# Patient Record
Sex: Male | Born: 1955 | Race: White | Hispanic: No | Smoking: Former smoker
Health system: Southern US, Community
[De-identification: ages and names within clinical notes are randomized; demographics above are authoritative.]

## PROBLEM LIST (undated history)

## (undated) DIAGNOSIS — I10 Essential (primary) hypertension: Secondary | ICD-10-CM

## (undated) DIAGNOSIS — C801 Malignant (primary) neoplasm, unspecified: Secondary | ICD-10-CM

## (undated) DIAGNOSIS — J189 Pneumonia, unspecified organism: Secondary | ICD-10-CM

## (undated) DIAGNOSIS — R519 Headache, unspecified: Secondary | ICD-10-CM

## (undated) DIAGNOSIS — J449 Chronic obstructive pulmonary disease, unspecified: Secondary | ICD-10-CM

## (undated) DIAGNOSIS — R350 Frequency of micturition: Secondary | ICD-10-CM

## (undated) DIAGNOSIS — I209 Angina pectoris, unspecified: Secondary | ICD-10-CM

## (undated) DIAGNOSIS — K219 Gastro-esophageal reflux disease without esophagitis: Secondary | ICD-10-CM

## (undated) DIAGNOSIS — M1990 Unspecified osteoarthritis, unspecified site: Secondary | ICD-10-CM

## (undated) DIAGNOSIS — F419 Anxiety disorder, unspecified: Secondary | ICD-10-CM

## (undated) DIAGNOSIS — F1911 Other psychoactive substance abuse, in remission: Secondary | ICD-10-CM

## (undated) DIAGNOSIS — Z789 Other specified health status: Secondary | ICD-10-CM

## (undated) HISTORY — PX: NASAL RECONSTRUCTION: SHX2069

---

## 1989-11-16 DIAGNOSIS — F1911 Other psychoactive substance abuse, in remission: Secondary | ICD-10-CM

## 1989-11-16 HISTORY — DX: Other psychoactive substance abuse, in remission: F19.11

## 2011-09-09 ENCOUNTER — Emergency Department (HOSPITAL_COMMUNITY)
Admission: EM | Admit: 2011-09-09 | Discharge: 2011-09-09 | Disposition: A | Discharge status: Home / Self Care | Payer: Commercial Managed Care - PPO | Attending: Emergency Medicine | Admitting: Emergency Medicine

## 2011-09-09 DIAGNOSIS — G8929 Other chronic pain: Secondary | ICD-10-CM | POA: Insufficient documentation

## 2011-09-09 DIAGNOSIS — IMO0001 Reserved for inherently not codable concepts without codable children: Secondary | ICD-10-CM | POA: Insufficient documentation

## 2011-09-09 DIAGNOSIS — M549 Dorsalgia, unspecified: Secondary | ICD-10-CM | POA: Insufficient documentation

## 2012-08-24 ENCOUNTER — Other Ambulatory Visit (HOSPITAL_COMMUNITY): Payer: Self-pay | Admitting: Orthopaedic Surgery

## 2012-09-30 ENCOUNTER — Encounter (HOSPITAL_COMMUNITY): Payer: Self-pay | Admitting: Pharmacy Technician

## 2012-10-03 ENCOUNTER — Encounter (HOSPITAL_COMMUNITY): Payer: Self-pay

## 2012-10-03 ENCOUNTER — Encounter (HOSPITAL_COMMUNITY)
Admission: RE | Admit: 2012-10-03 | Discharge: 2012-10-03 | Disposition: A | Discharge status: Home / Self Care | Payer: Commercial Managed Care - PPO | Source: Ambulatory Visit | Attending: Orthopaedic Surgery | Admitting: Orthopaedic Surgery

## 2012-10-03 HISTORY — DX: Angina pectoris, unspecified: I20.9

## 2012-10-03 HISTORY — DX: Other psychoactive substance abuse, in remission: F19.11

## 2012-10-03 HISTORY — DX: Unspecified osteoarthritis, unspecified site: M19.90

## 2012-10-03 LAB — BASIC METABOLIC PANEL WITH GFR
BUN: 13 mg/dL (ref 6–23)
CO2: 30 meq/L (ref 19–32)
Calcium: 9.8 mg/dL (ref 8.4–10.5)
Chloride: 101 meq/L (ref 96–112)
Creatinine, Ser: 0.86 mg/dL (ref 0.50–1.35)
GFR calc Af Amer: >90 mL/min
GFR calc non Af Amer: >90 mL/min
Glucose, Bld: 100 mg/dL — ABNORMAL HIGH (ref 70–99)
Potassium: 4.8 meq/L (ref 3.5–5.1)
Sodium: 138 meq/L (ref 135–145)

## 2012-10-03 LAB — URINALYSIS, ROUTINE W REFLEX MICROSCOPIC
Glucose, UA: NEGATIVE mg/dL
Hgb urine dipstick: NEGATIVE
Ketones, ur: NEGATIVE mg/dL
Leukocytes, UA: NEGATIVE
Nitrite: NEGATIVE
Protein, ur: NEGATIVE mg/dL
Specific Gravity, Urine: 1.036 — ABNORMAL HIGH (ref 1.005–1.030)
Urobilinogen, UA: 0.2 mg/dL (ref 0.0–1.0)
pH: 6 (ref 5.0–8.0)

## 2012-10-03 LAB — APTT: aPTT: 32 seconds (ref 24–37)

## 2012-10-03 LAB — CBC
HCT: 43.9 % (ref 39.0–52.0)
Hemoglobin: 15.2 g/dL (ref 13.0–17.0)
MCV: 85.4 fL (ref 78.0–100.0)
RBC: 5.14 MIL/uL (ref 4.22–5.81)
RDW: 12.2 % (ref 11.5–15.5)
WBC: 8.5 10*3/uL (ref 4.0–10.5)

## 2012-10-03 LAB — PROTIME-INR
INR: 0.98 (ref 0.00–1.49)
Prothrombin Time: 12.9 seconds (ref 11.6–15.2)

## 2012-10-03 LAB — SURGICAL PCR SCREEN
MRSA, PCR: NEGATIVE
Staphylococcus aureus: NEGATIVE

## 2012-10-03 NOTE — Progress Notes (Signed)
Faxed Blood refusal signature paper to [facility] with confirmation received, also noted under FYI column above. Attempted to obtain old EKG for review from approx 84yrs ago from [facility] and was told none available.  EKF from today reviewed by Dr Harriet Masson, anesthesia.  Faxed FYI to Dr Magnus Ivan regarding blood products to [phone] with confirmation

## 2012-10-03 NOTE — Progress Notes (Signed)
10/03/12 1015  OBSTRUCTIVE SLEEP APNEA  Have you ever been diagnosed with sleep apnea through a sleep study? No  Do you snore loudly (loud enough to be heard through closed doors)?  1  Do you often feel tired, fatigued, or sleepy during the daytime? 1  Has anyone observed you stop breathing during your sleep? 0  Do you have, or are you being treated for high blood pressure? 0  BMI more than 35 kg/m2? 0  Age over 57 years old? 1  Neck circumference greater than 40 cm/18 inches? 0  Gender: 1  Obstructive Sleep Apnea Score 4   Score 4 or greater  Results sent to PCP

## 2012-10-03 NOTE — Patient Instructions (Addendum)
20 Vincent Herrera  10/03/2012   Your procedure is scheduled on:  10/07/12   Friday    Surgery [id number]  Report to [facility] at  0515     AM.  Call this number if you have problems the morning of surgery: [phone]      Remember:   Do not eat food  Or drink :After Midnight. Thursday NIGHT   Take these medicines the morning of surgery with A SIP OF WATER:  MAY TAKE NORCO  ,ROBAXIN   IF NEEDED   .  Contacts, dentures or partial plates can not be worn to surgery  Leave suitcase in the car. After surgery it may be brought to your room.  For patients admitted to the hospital, checkout time is 11:00 AM day of  discharge.             SPECIAL INSTRUCTIONS- SEE [place] PREPARING FOR SURGERY INSTRUCTION SHEET-     DO NOT WEAR JEWELRY, LOTIONS, POWDERS, OR PERFUMES.  WOMEN-- DO NOT SHAVE LEGS OR UNDERARMS FOR 12 HOURS BEFORE SHOWERS. MEN MAY SHAVE FACE.  Patients discharged the day of surgery will not be allowed to drive home. IF going home the day of surgery, you must have a driver and someone to stay with you for the first 24 hours  Name and phone number of your driver:    ??? SON                                                                        Please read over the following fact sheets that you were given: MRSA Information, Incentive Spirometry Sheet, Blood Transfusion Sheet  Information                                                                                   [name]  PST [phone]  [phone]

## 2012-10-04 LAB — NO BLOOD PRODUCTS

## 2012-10-04 NOTE — Progress Notes (Signed)
Pre Cordelia Pen at Dr Vevelyn Royals office- MD is aware of refusal of blood

## 2012-10-06 NOTE — Anesthesia Preprocedure Evaluation (Addendum)
Anesthesia Evaluation  Patient identified by MRN, date of birth, ID band Patient awake    Reviewed: Allergy & Precautions, H&P , NPO status , Patient's Chart, lab work & pertinent test results  Airway Mallampati: II TM Distance: >3 FB Neck ROM: full    Dental  (+) Edentulous Upper and Dental Advisory Given   Pulmonary Current Smoker,  breath sounds clear to auscultation  Pulmonary exam normal       Cardiovascular Exercise Tolerance: Good negative cardio ROS  Rhythm:regular Rate:Normal     Neuro/Psych negative neurological ROS  negative psych ROS   GI/Hepatic negative GI ROS, Neg liver ROS,   Endo/Other  negative endocrine ROS  Renal/GU negative Renal ROS  negative genitourinary   Musculoskeletal   Abdominal   Peds  Hematology negative hematology ROS (+)   Anesthesia Other Findings   Reproductive/Obstetrics negative OB ROS                          Anesthesia Physical Anesthesia Plan  ASA: II  Anesthesia Plan: General   Post-op Pain Management:    Induction: Intravenous  Airway Management Planned: Oral ETT  Additional Equipment:   Intra-op Plan:   Post-operative Plan: Extubation in OR  Informed Consent: I have reviewed the patients History and Physical, chart, labs and discussed the procedure including the risks, benefits and alternatives for the proposed anesthesia with the patient or authorized representative who has indicated his/her understanding and acceptance.   Dental Advisory Given  Plan Discussed with: CRNA  Anesthesia Plan Comments:        Anesthesia Quick Evaluation

## 2012-10-07 ENCOUNTER — Encounter (HOSPITAL_COMMUNITY)
Admission: RE | Disposition: A | Discharge status: Home Health | Payer: Self-pay | Source: Ambulatory Visit | Attending: Orthopaedic Surgery

## 2012-10-07 ENCOUNTER — Inpatient Hospital Stay (HOSPITAL_COMMUNITY): Payer: Commercial Managed Care - PPO

## 2012-10-07 ENCOUNTER — Inpatient Hospital Stay (HOSPITAL_COMMUNITY)
Admission: RE | Admit: 2012-10-07 | Discharge: 2012-10-10 | DRG: 470 | Disposition: A | Discharge status: Home Health | Payer: Commercial Managed Care - PPO | Source: Ambulatory Visit | Attending: Orthopaedic Surgery | Admitting: Orthopaedic Surgery

## 2012-10-07 ENCOUNTER — Inpatient Hospital Stay (HOSPITAL_COMMUNITY): Payer: Commercial Managed Care - PPO | Admitting: Anesthesiology

## 2012-10-07 ENCOUNTER — Encounter (HOSPITAL_COMMUNITY): Payer: Self-pay | Admitting: *Deleted

## 2012-10-07 ENCOUNTER — Encounter (HOSPITAL_COMMUNITY): Payer: Self-pay | Admitting: Anesthesiology

## 2012-10-07 DIAGNOSIS — Z79899 Other long term (current) drug therapy: Secondary | ICD-10-CM

## 2012-10-07 DIAGNOSIS — Z01810 Encounter for preprocedural cardiovascular examination: Secondary | ICD-10-CM

## 2012-10-07 DIAGNOSIS — M169 Osteoarthritis of hip, unspecified: Secondary | ICD-10-CM

## 2012-10-07 DIAGNOSIS — D62 Acute posthemorrhagic anemia: Secondary | ICD-10-CM | POA: Diagnosis not present

## 2012-10-07 DIAGNOSIS — Z01812 Encounter for preprocedural laboratory examination: Secondary | ICD-10-CM

## 2012-10-07 DIAGNOSIS — M1610 Unilateral primary osteoarthritis, unspecified hip: Principal | ICD-10-CM | POA: Diagnosis present

## 2012-10-07 DIAGNOSIS — F17200 Nicotine dependence, unspecified, uncomplicated: Secondary | ICD-10-CM | POA: Diagnosis present

## 2012-10-07 HISTORY — PX: TOTAL HIP ARTHROPLASTY: SHX124

## 2012-10-07 SURGERY — ARTHROPLASTY, HIP, TOTAL, ANTERIOR APPROACH
Anesthesia: General | Site: Hip | Laterality: Left | Wound class: Clean

## 2012-10-07 MED ORDER — LIDOCAINE HCL (CARDIAC) 20 MG/ML IV SOLN
INTRAVENOUS | Status: DC | PRN
  Administered 2012-10-07: 50 mg via INTRAVENOUS
  Administered 2012-10-07: 30 mg via INTRAVENOUS

## 2012-10-07 MED ORDER — LACTATED RINGERS IV SOLN: INTRAVENOUS | Status: DC

## 2012-10-07 MED ORDER — KETOROLAC TROMETHAMINE 15 MG/ML IJ SOLN
15.0000 mg | Freq: Four times a day (QID) | INTRAMUSCULAR | Status: AC
  Administered 2012-10-07 – 2012-10-08 (×3): 15 mg via INTRAVENOUS
  Filled 2012-10-07 (×3): qty 1

## 2012-10-07 MED ORDER — LACTATED RINGERS IV SOLN
INTRAVENOUS | Status: DC | PRN
  Administered 2012-10-07 (×2): via INTRAVENOUS

## 2012-10-07 MED ORDER — FENTANYL CITRATE 0.05 MG/ML IJ SOLN
INTRAMUSCULAR | Status: DC | PRN
  Administered 2012-10-07: 100 ug via INTRAVENOUS

## 2012-10-07 MED ORDER — LABETALOL HCL 5 MG/ML IV SOLN
INTRAVENOUS | Status: DC | PRN
  Administered 2012-10-07: 2.5 mg via INTRAVENOUS

## 2012-10-07 MED ORDER — NEOSTIGMINE METHYLSULFATE 1 MG/ML IJ SOLN
INTRAMUSCULAR | Status: DC | PRN
  Administered 2012-10-07: 4 mg via INTRAVENOUS

## 2012-10-07 MED ORDER — MIDAZOLAM HCL 5 MG/5ML IJ SOLN
INTRAMUSCULAR | Status: DC | PRN
  Administered 2012-10-07 (×2): 1 mg via INTRAVENOUS

## 2012-10-07 MED ORDER — ROCURONIUM BROMIDE 100 MG/10ML IV SOLN
INTRAVENOUS | Status: DC | PRN
  Administered 2012-10-07: 15 mg via INTRAVENOUS
  Administered 2012-10-07: 5 mg via INTRAVENOUS
  Administered 2012-10-07: 10 mg via INTRAVENOUS
  Administered 2012-10-07: 20 mg via INTRAVENOUS

## 2012-10-07 MED ORDER — PROPOFOL 10 MG/ML IV BOLUS
INTRAVENOUS | Status: DC | PRN
  Administered 2012-10-07: 170 mg via INTRAVENOUS

## 2012-10-07 MED ORDER — METHOCARBAMOL 100 MG/ML IJ SOLN
500.0000 mg | Freq: Once | INTRAVENOUS | Status: AC
  Administered 2012-10-07: 500 mg via INTRAVENOUS
  Filled 2012-10-07: qty 5

## 2012-10-07 MED ORDER — SUCCINYLCHOLINE CHLORIDE 20 MG/ML IJ SOLN
INTRAMUSCULAR | Status: DC | PRN
  Administered 2012-10-07: 100 mg via INTRAVENOUS

## 2012-10-07 MED ORDER — SODIUM CHLORIDE 0.9 % IV SOLN
INTRAVENOUS | Status: DC
  Administered 2012-10-07 – 2012-10-08 (×2): via INTRAVENOUS

## 2012-10-07 MED ORDER — HYDROMORPHONE HCL PF 1 MG/ML IJ SOLN
0.2500 mg | INTRAMUSCULAR | Status: DC | PRN
  Administered 2012-10-07 (×3): 0.5 mg via INTRAVENOUS

## 2012-10-07 MED ORDER — METOCLOPRAMIDE HCL 5 MG/ML IJ SOLN: 5.0000 mg | Freq: Three times a day (TID) | INTRAMUSCULAR | Status: DC | PRN

## 2012-10-07 MED ORDER — HYDROMORPHONE HCL PF 1 MG/ML IJ SOLN
INTRAMUSCULAR | Status: DC | PRN
  Administered 2012-10-07: 1 mg via INTRAVENOUS
  Administered 2012-10-07 (×2): 0.5 mg via INTRAVENOUS

## 2012-10-07 MED ORDER — GLYCOPYRROLATE 0.2 MG/ML IJ SOLN
INTRAMUSCULAR | Status: DC | PRN
  Administered 2012-10-07: .6 mg via INTRAVENOUS

## 2012-10-07 MED ORDER — ONDANSETRON HCL 4 MG/2ML IJ SOLN: 4.0000 mg | Freq: Four times a day (QID) | INTRAMUSCULAR | Status: DC | PRN

## 2012-10-07 MED ORDER — ACETAMINOPHEN 10 MG/ML IV SOLN
INTRAVENOUS | Status: DC | PRN
  Administered 2012-10-07: 1000 mg via INTRAVENOUS

## 2012-10-07 MED ORDER — ONDANSETRON HCL 4 MG PO TABS: 4.0000 mg | ORAL_TABLET | Freq: Four times a day (QID) | ORAL | Status: DC | PRN

## 2012-10-07 MED ORDER — PHENOL 1.4 % MT LIQD: 1.0000 | OROMUCOSAL | Status: DC | PRN

## 2012-10-07 MED ORDER — ASPIRIN EC 325 MG PO TBEC
325.0000 mg | DELAYED_RELEASE_TABLET | Freq: Two times a day (BID) | ORAL | Status: DC
  Administered 2012-10-08 – 2012-10-10 (×5): 325 mg via ORAL
  Filled 2012-10-07 (×7): qty 1

## 2012-10-07 MED ORDER — OXYCODONE HCL 5 MG PO TABS
5.0000 mg | ORAL_TABLET | ORAL | Status: DC | PRN
  Administered 2012-10-07 – 2012-10-10 (×11): 10 mg via ORAL
  Filled 2012-10-07 (×11): qty 2

## 2012-10-07 MED ORDER — MENTHOL 3 MG MT LOZG: 1.0000 | LOZENGE | OROMUCOSAL | Status: DC | PRN

## 2012-10-07 MED ORDER — CEFAZOLIN SODIUM 1-5 GM-% IV SOLN
1.0000 g | Freq: Four times a day (QID) | INTRAVENOUS | Status: AC
  Administered 2012-10-07 (×2): 1 g via INTRAVENOUS
  Filled 2012-10-07 (×2): qty 50

## 2012-10-07 MED ORDER — ACETAMINOPHEN 325 MG PO TABS
650.0000 mg | ORAL_TABLET | Freq: Four times a day (QID) | ORAL | Status: DC | PRN
  Administered 2012-10-08 – 2012-10-10 (×2): 650 mg via ORAL
  Filled 2012-10-07 (×2): qty 2

## 2012-10-07 MED ORDER — ONDANSETRON HCL 4 MG/2ML IJ SOLN
INTRAMUSCULAR | Status: DC | PRN
  Administered 2012-10-07: 4 mg via INTRAVENOUS

## 2012-10-07 MED ORDER — ALUM & MAG HYDROXIDE-SIMETH 200-200-20 MG/5ML PO SUSP: 30.0000 mL | ORAL | Status: DC | PRN

## 2012-10-07 MED ORDER — SUFENTANIL CITRATE 50 MCG/ML IV SOLN
INTRAVENOUS | Status: DC | PRN
  Administered 2012-10-07 (×2): 10 ug via INTRAVENOUS
  Administered 2012-10-07 (×6): 5 ug via INTRAVENOUS

## 2012-10-07 MED ORDER — METHOCARBAMOL 500 MG PO TABS
500.0000 mg | ORAL_TABLET | Freq: Four times a day (QID) | ORAL | Status: DC | PRN
  Administered 2012-10-07 – 2012-10-10 (×6): 500 mg via ORAL
  Filled 2012-10-07 (×6): qty 1

## 2012-10-07 MED ORDER — HYDROMORPHONE HCL PF 1 MG/ML IJ SOLN
1.0000 mg | INTRAMUSCULAR | Status: DC | PRN
  Administered 2012-10-07 (×2): 1 mg via INTRAVENOUS
  Filled 2012-10-07 (×2): qty 1

## 2012-10-07 MED ORDER — ACETAMINOPHEN 650 MG RE SUPP: 650.0000 mg | Freq: Four times a day (QID) | RECTAL | Status: DC | PRN

## 2012-10-07 MED ORDER — METOCLOPRAMIDE HCL 10 MG PO TABS: 5.0000 mg | ORAL_TABLET | Freq: Three times a day (TID) | ORAL | Status: DC | PRN

## 2012-10-07 MED ORDER — NICOTINE 21 MG/24HR TD PT24
21.0000 mg | MEDICATED_PATCH | Freq: Every day | TRANSDERMAL | Status: DC
  Administered 2012-10-07 – 2012-10-09 (×3): 21 mg via TRANSDERMAL
  Filled 2012-10-07 (×4): qty 1

## 2012-10-07 MED ORDER — FERROUS SULFATE 325 (65 FE) MG PO TABS
325.0000 mg | ORAL_TABLET | Freq: Three times a day (TID) | ORAL | Status: DC
  Administered 2012-10-07 – 2012-10-10 (×8): 325 mg via ORAL
  Filled 2012-10-07 (×12): qty 1

## 2012-10-07 MED ORDER — OXYCODONE HCL ER 20 MG PO T12A
20.0000 mg | EXTENDED_RELEASE_TABLET | Freq: Two times a day (BID) | ORAL | Status: DC
  Administered 2012-10-07 – 2012-10-10 (×7): 20 mg via ORAL
  Filled 2012-10-07 (×7): qty 1

## 2012-10-07 MED ORDER — CEFAZOLIN SODIUM-DEXTROSE 2-3 GM-% IV SOLR
2.0000 g | INTRAVENOUS | Status: AC
  Administered 2012-10-07: 2 g via INTRAVENOUS

## 2012-10-07 SURGICAL SUPPLY — 34 items
BAG ZIPLOCK 12X15 (MISCELLANEOUS) ×4 IMPLANT
BLADE SAW SGTL 18X1.27X75 (BLADE) ×2 IMPLANT
CLOTH BEACON ORANGE TIMEOUT ST (SAFETY) ×2 IMPLANT
DRAPE C-ARM 42X72 X-RAY (DRAPES) ×2 IMPLANT
DRAPE STERI IOBAN 125X83 (DRAPES) ×2 IMPLANT
DRAPE U-SHAPE 47X51 STRL (DRAPES) ×6 IMPLANT
DRSG MEPILEX BORDER 4X8 (GAUZE/BANDAGES/DRESSINGS) ×2 IMPLANT
DURAPREP 26ML APPLICATOR (WOUND CARE) ×2 IMPLANT
ELECT BLADE TIP CTD 4 INCH (ELECTRODE) ×2 IMPLANT
ELECT REM PT RETURN 9FT ADLT (ELECTROSURGICAL) ×2
ELECTRODE REM PT RTRN 9FT ADLT (ELECTROSURGICAL) ×1 IMPLANT
FACESHIELD LNG OPTICON STERILE (SAFETY) ×8 IMPLANT
GAUZE XEROFORM 1X8 LF (GAUZE/BANDAGES/DRESSINGS) ×2 IMPLANT
GLOVE BIO SURGEON STRL SZ7 (GLOVE) ×2 IMPLANT
GLOVE BIO SURGEON STRL SZ7.5 (GLOVE) ×2 IMPLANT
GLOVE BIOGEL PI IND STRL 7.5 (GLOVE) IMPLANT
GLOVE BIOGEL PI IND STRL 8 (GLOVE) ×1 IMPLANT
GLOVE BIOGEL PI INDICATOR 7.5 (GLOVE)
GLOVE BIOGEL PI INDICATOR 8 (GLOVE) ×1
GLOVE ECLIPSE 7.0 STRL STRAW (GLOVE) ×2 IMPLANT
GLOVE SURG SS PI 7.5 STRL IVOR (GLOVE) ×8 IMPLANT
GOWN STRL REIN XL XLG (GOWN DISPOSABLE) ×10 IMPLANT
KIT BASIN OR (CUSTOM PROCEDURE TRAY) ×2 IMPLANT
PACK TOTAL JOINT (CUSTOM PROCEDURE TRAY) ×2 IMPLANT
PADDING CAST COTTON 6X4 STRL (CAST SUPPLIES) ×2 IMPLANT
STAPLER VISISTAT 35W (STAPLE) IMPLANT
SUT ETHIBOND NAB CT1 #1 30IN (SUTURE) IMPLANT
SUT VIC AB 1 CT1 36 (SUTURE) IMPLANT
SUT VIC AB 2-0 CT1 27 (SUTURE) ×2
SUT VIC AB 2-0 CT1 TAPERPNT 27 (SUTURE) ×2 IMPLANT
SUT VLOC 180 0 24IN GS25 (SUTURE) ×2 IMPLANT
TOWEL OR 17X26 10 PK STRL BLUE (TOWEL DISPOSABLE) ×4 IMPLANT
TOWEL OR NON WOVEN STRL DISP B (DISPOSABLE) ×2 IMPLANT
TRAY FOLEY CATH 14FRSI W/METER (CATHETERS) ×2 IMPLANT

## 2012-10-07 NOTE — Brief Op Note (Signed)
10/07/2012  9:32 AM  PATIENT:  Vincent Herrera  56 y.o. male  PRE-OPERATIVE DIAGNOSIS:  Severe osteoarthritis left hip  POST-OPERATIVE DIAGNOSIS:  Severe osteoarthritis left hip  PROCEDURE:  Procedure(s) (LRB) with comments: TOTAL HIP ARTHROPLASTY ANTERIOR APPROACH (Left) - Left Total Hip Arthroplasty, Anterior Approach (C-Arm)  SURGEON:  Surgeon(s) and Role:    * Kathryne Hitch, MD - Primary  PHYSICIAN ASSISTANT:   ASSISTANTS: none   ANESTHESIA:   general  EBL:  Total I/O In: 2000 [I.V.:2000] Out: 485 [Urine:135; Blood:350]  BLOOD ADMINISTERED:none  DRAINS: none   LOCAL MEDICATIONS USED:  NONE  SPECIMEN:  No Specimen  DISPOSITION OF SPECIMEN:  N/A  COUNTS:  YES  TOURNIQUET:  * No tourniquets in log *  DICTATION: .Other Dictation: Dictation Number [id number]  PLAN OF CARE: Admit to inpatient   PATIENT DISPOSITION:  PACU - hemodynamically stable.   Delay start of Pharmacological VTE agent (>24hrs) due to surgical blood loss or risk of bleeding: no

## 2012-10-07 NOTE — Anesthesia Postprocedure Evaluation (Signed)
  Anesthesia Post-op Note  Patient: Vincent Herrera  Procedure(s) Performed: Procedure(s) (LRB): TOTAL HIP ARTHROPLASTY ANTERIOR APPROACH (Left)  Patient Location: PACU  Anesthesia Type: General  Level of Consciousness: awake and alert   Airway and Oxygen Therapy: Patient Spontanous Breathing  Post-op Pain: mild  Post-op Assessment: Post-op Vital signs reviewed, Patient's Cardiovascular Status Stable, Respiratory Function Stable, Patent Airway and No signs of Nausea or vomiting  Last Vitals:  Filed Vitals:   10/07/12 1030  BP: 121/66  Pulse: 69  Temp:   Resp: 14    Post-op Vital Signs: stable   Complications: No apparent anesthesia complications

## 2012-10-07 NOTE — Op Note (Signed)
NAMEMarland Kitchen  [name], [name] NO.:  [id number]  MEDICAL RECORD NO.:  [id number]  LOCATION:  1611                         FACILITY:  [facility]  PHYSICIAN:  Vanita Panda. Magnus Ivan, M.D.DATE OF BIRTH:  [birth year]  DATE OF PROCEDURE:  10/07/2012 DATE OF DISCHARGE:                              OPERATIVE REPORT   PREOPERATIVE DIAGNOSIS:  Severe end-stage arthritis and degenerative joint disease, left hip.  POSTOPERATIVE DIAGNOSIS:  Severe end-stage arthritis and degenerative joint disease, left hip.  PROCEDURE:  Left total hip arthroplasty through direct anterior approach.  IMPLANTS:  DePuy Sector Gription acetabular component size 54, size 36+ 4 neutral polyethylene liner, size 10 Corail femoral component with standard offset, size 36+ 1.5 ceramic hip ball.  SURGEON:  Vanita Panda. Magnus Ivan, MD  ANESTHESIA:  General.  ANTIBIOTICS:  IV Ancef 2 g.  BLOOD LOSS:  Less than 500 mL.  COMPLICATIONS:  None.  INDICATIONS:  Vincent Herrera is a very active 56 year old gentleman with severe bilateral hip osteoarthritis with bone-on-bone wear on x-rays with total loss of joint space, subchondral sclerosis, peripheral osteophytes as well as cyst with changes.  This affects his activities of daily living.  His walking is very tough.  His work has been very tough and at this point with failure of conservative treatment, wished to proceed with at least a left total hip arthroplasty first and later right total hip.  Risks and benefits of surgery were explained to him in detail and he does wish to proceed.  PROCEDURE DESCRIPTION:  After informed consent was obtained, appropriate left hip was marked.  He was brought to the operating room and general anesthesia was obtained while he was on the stretcher.  Foley catheter was placed and in both feet had traction boots applied to them.  He was then placed supine on the Hana fracture table with perineal post in place and both feet in  inline skeletal traction but no traction applied. His left operative hip was then assessed fluoroscopically, so he could obtain appropriate leg lengths and assessment of the hip in general.  We then prepped the left ankle with DuraPrep and sterile drapes.  Time-out was called and he was identified as correct patient, correct left hip. I then made an incision distal and posterior to the anterior-superior iliac spine and carried this obliquely down the leg.  I dissected down to the tensor fascia lata and the tensor fascia lata was divided longitudinally.  I then proceeded with a direct anterior approach to the hip.  A Cobra retractor was placed around the lateral neck and up underneath the rectus femoris.  A medial retractor was placed.  I cauterized the lateral femoral circumflex vessels and then I opened the joint capsule.  There was large effusion and it was noted as well.  I then placed the Cobra retractor within the femoral neck around itself. I made my femoral neck cut just proximal to lesser trochanter with an oscillating saw and completed this with an osteotome.  I then placed a corkscrew guide in the femoral head and moved the femoral head in its entirety.  I cleaned the acetabulum of debris and placed a bent Hohmann medially and a [employer]  retractor laterally.  I then began reaming from size 44 reamer in 2 mm increments up to a size 54 reamer with all reamers placed under direct visualization, the last reamer also placed under direct fluoroscopy, so I could obtain my depth of reaming, my inclination, and anteversion.  I then placed the real Sector Gription acetabular component size 54 followed by 36 +4 neutral polyethylene liner.  Attention was then turned to the femur with the leg externally rotated to 90 degrees, extended and adducted.  I gained access to the femoral canal after Mueller retractor was placed medially and a bent Hohmann under the greater trochanter.  I released the  lateral capsule and then used a box cutting guide to open the femoral canal.  I then began broaching with a size 8, broached to a size 10, and the 10 was felt to be stable.  So, I trialed a standard neck and a 36+ 1.5 hip ball.  We brought the leg back over and up with traction and internal rotation, reduced this in acetabulum.  It was stable with internal and external rotation with minimal shuck.  His leg lengths were measured and were equal on fluoroscopy.  We then dislocated the hip and brought the leg back down and over.  I removed the trial components and placed the real Corail femoral component from DePuy size 10 with standard offset and the real 36+ 1.5 ceramic hip ball after reducing the hip.  It was again stable.  We copiously irrigated the soft tissues and deep tissues with normal saline solution.  I closed the joint capsule with interrupted #1 Ethibond suture followed by running 0 V-Loc and tensor fascia #1, 2-0 Vicryl in the subcutaneous tissue, and staples on the skin.  Xeroform and well-padded sterile dressing was applied.  He was taken off the Hana table, awakened, extubated, and taken to recovery room in stable condition.  All final counts were correct and no complications noted.     Vanita Panda. Magnus Ivan, M.D.     CYB/MEDQ  D:  10/07/2012  T:  10/07/2012  Job:  [id number]

## 2012-10-07 NOTE — Care Management Note (Signed)
  Page 1 of 1   10/07/2012     4:46:41 PM   CARE MANAGEMENT NOTE 10/07/2012  Patient:  Vincent Herrera, Vincent Herrera   Account Number:  [id number]  Date Initiated:  10/07/2012  Documentation initiated by:  Colleen Can  Subjective/Objective Assessment:   DX  LEFT ANTERIOR HIP ARTHROPLASTY     Action/Plan:   CM spoke with patient and son. Plans are for patient to return to his home in [place] where son will be caregiver.   Anticipated DC Date:  10/10/2012   Anticipated DC Plan:  HOME W HOME HEALTH SERVICES  In-house referral  NA      DC Planning Services  CM consult      [facility] Choice  HOME HEALTH   Choice offered to / List presented to:  C-4 Adult Children           HH agency  [facility]   Status of service:  In process, will continue to follow Medicare Important Message given?   (If response is "NO", the following Medicare IM given date fields will be blank) Date Medicare IM given:   Date Additional Medicare IM given:    Discharge Disposition:    Per UR Regulation:  Reviewed for med. necessity/level of care/duration of stay  If discussed at Long Length of Stay Meetings, dates discussed:    Comments:

## 2012-10-07 NOTE — Transfer of Care (Signed)
Immediate Anesthesia Transfer of Care Note  Patient: Vincent Herrera  Procedure(s) Performed: Procedure(s) (LRB) with comments: TOTAL HIP ARTHROPLASTY ANTERIOR APPROACH (Left) - Left Total Hip Arthroplasty, Anterior Approach (C-Arm)  Patient Location: PACU  Anesthesia Type:General  Level of Consciousness: awake, oriented and pateint uncooperative  Airway & Oxygen Therapy: Patient Spontanous Breathing  Post-op Assessment: Report given to PACU RN, Post -op Vital signs reviewed and stable and Patient moving all extremities  Post vital signs: Reviewed and stable  Complications: No apparent anesthesia complications

## 2012-10-07 NOTE — Evaluation (Signed)
Physical Therapy Evaluation Patient Details Name: Vincent Herrera MRN: [id number] DOB: 29-Nov-1955 Today's Date: 10/07/2012 Time: 7829-5621 PT Time Calculation (min): 28 min  PT Assessment / Plan / Recommendation Clinical Impression  Pt s/p L THR presents with decreased L LE strength/ROM and post op pain limiting functional mobility    PT Assessment  Patient needs continued PT services    Follow Up Recommendations  Home health PT    Does the patient have the potential to tolerate intense rehabilitation      Barriers to Discharge        Equipment Recommendations  None recommended by PT    Recommendations for Other Services OT consult   Frequency 7X/week    Precautions / Restrictions Precautions Precautions: None Restrictions Weight Bearing Restrictions: No Other Position/Activity Restrictions: WBAT   Pertinent Vitals/Pain Pt c/o L Thigh pain but unable to rate out of 10; premedicated, ice pack provided      Mobility  Bed Mobility Bed Mobility: Supine to Sit;Sit to Supine Supine to Sit: 1: +2 Total assist Supine to Sit: Patient Percentage: 60% Sit to Supine: 1: +2 Total assist Sit to Supine: Patient Percentage: 30% Details for Bed Mobility Assistance: cues for sequence and use of UEs and R LE to self assist Transfers Transfers: Sit to Stand;Stand to Sit Sit to Stand: 1: +2 Total assist Sit to Stand: Patient Percentage: 50% Stand to Sit: 1: +2 Total assist Stand to Sit: Patient Percentage: 50% Details for Transfer Assistance: cues for use of UEs and for LE management Ambulation/Gait Ambulation/Gait Assistance:  (NT - pt stood at bedside only - ltd by discomfort)    Shoulder Instructions     Exercises     PT Diagnosis: Difficulty walking  PT Problem List: Decreased strength;Decreased range of motion;Decreased activity tolerance;Decreased mobility;Decreased knowledge of use of DME;Pain PT Treatment Interventions: DME instruction;Gait training;Stair  training;Functional mobility training;Therapeutic activities;Therapeutic exercise;Patient/family education   PT Goals Acute Rehab PT Goals PT Goal Formulation: With patient Time For Goal Achievement: 10/12/12 Potential to Achieve Goals: Good Pt will go Supine/Side to Sit: with supervision PT Goal: Supine/Side to Sit - Progress: Goal set today Pt will go Sit to Supine/Side: with supervision PT Goal: Sit to Supine/Side - Progress: Goal set today Pt will go Sit to Stand: with supervision PT Goal: Sit to Stand - Progress: Goal set today Pt will go Stand to Sit: with supervision PT Goal: Stand to Sit - Progress: Goal set today Pt will Ambulate: 51 - 150 feet;with supervision;with rolling walker PT Goal: Ambulate - Progress: Goal set today Pt will Go Up / Down Stairs: 3-5 stairs;with min assist;with least restrictive assistive device PT Goal: Up/Down Stairs - Progress: Goal set today  Visit Information  Last PT Received On: 10/07/12 Assistance Needed: +2    Subjective Data  Subjective: I worked right up until I came in here and this leg was killing me. Patient Stated Goal: Walk with decreased pain   Prior Functioning  Home Living Lives With: Alone Available Help at Discharge: Family (Son plans to stay with temporarily) Type of Home: Mobile home Home Access: Stairs to enter Secretary/administrator of Steps: 3 Entrance Stairs-Rails: Left Home Layout: One level Home Adaptive Equipment: Walker - rolling;Straight cane Prior Function Level of Independence: Independent with assistive device(s);Independent Able to Take Stairs?: Yes Driving: Yes Vocation: Full time employment Communication Communication: No difficulties    Cognition  Overall Cognitive Status: Appears within functional limits for tasks assessed/performed Arousal/Alertness: Awake/alert Orientation Level: Appears intact for  tasks assessed Behavior During Session: [facility] for tasks performed Cognition - Other Comments:  occasionally slow to process cues - ? MeDS    Extremity/Trunk Assessment Right Upper Extremity Assessment RUE ROM/Strength/Tone: [facility] for tasks assessed Left Upper Extremity Assessment LUE ROM/Strength/Tone: WFL for tasks assessed Right Lower Extremity Assessment RLE ROM/Strength/Tone: WFL for tasks assessed Left Lower Extremity Assessment LLE ROM/Strength/Tone: Deficits LLE ROM/Strength/Tone Deficits: ltd all planes 2* discomfort   Balance    End of Session PT - End of Session Equipment Utilized During Treatment: Gait belt Activity Tolerance: Patient limited by pain;Patient limited by fatigue Patient left: in bed;with call bell/phone within reach;with family/visitor present Nurse Communication: Mobility status  GP     Rhiannon Sassaman 10/07/2012, 2:45 PM

## 2012-10-07 NOTE — H&P (Signed)
TOTAL HIP ADMISSION H&P  Patient is admitted for left total hip arthroplasty.  Subjective:  Chief Complaint: left hip pain  HPI: Vincent Herrera, 56 y.o. male, has a history of pain and functional disability in the left hip(s) due to arthritis and patient has failed non-surgical conservative treatments for greater than 12 weeks to include NSAID's and/or analgesics, use of assistive devices and activity modification.  Onset of symptoms was gradual starting 6 years ago with gradually worsening course since that time.The patient noted no past surgery on the left hip(s).  Patient currently rates pain in the left hip at 10 out of 10 with activity. Patient has night pain, worsening of pain with activity and weight bearing, trendelenberg gait, pain that interfers with activities of daily living, pain with passive range of motion and crepitus. Patient has evidence of subchondral cysts, subchondral sclerosis, periarticular osteophytes and joint space narrowing by imaging studies. This condition presents safety issues increasing the risk of falls.  There is no current active infection.  Patient Active Problem List   Diagnosis Date Noted  . Degenerative arthritis of hip 10/07/2012   Past Medical History  Diagnosis Date  . Arthritis   . History of substance abuse 1991    rehab/ quit 1991  . Anginal pain     anxiety related/ NEG ekg 10 yrs ago per patient, , no further cardiac testing    Past Surgical History  Procedure Date  . Nasal reconstruction     fractured nose    Prescriptions prior to admission  Medication Sig Dispense Refill  . HYDROcodone-acetaminophen (NORCO) 10-325 MG per tablet Take 1 tablet by mouth every 6 (six) hours as needed. Pain      . methocarbamol (ROBAXIN) 750 MG tablet Take 750 mg by mouth 3 (three) times daily as needed. Muscle spasm      . ibuprofen (ADVIL,MOTRIN) 200 MG tablet Take 600 mg by mouth every 6 (six) hours as needed. Pain       No Known Allergies  History    Substance Use Topics  . Smoking status: Current Every Day Smoker -- 1.5 packs/day for 40 years    Types: Cigarettes  . Smokeless tobacco: Never Used  . Alcohol Use: Yes     Comment: 6 pack/week    History reviewed. No pertinent family history.   Review of Systems  Musculoskeletal: Positive for joint pain.  All other systems reviewed and are negative.    Objective:  Physical Exam  Constitutional: He is oriented to person, place, and time. He appears well-developed and well-nourished.  HENT:  Head: Normocephalic and atraumatic.  Eyes: EOM are normal. Pupils are equal, round, and reactive to light.  Neck: Normal range of motion. Neck supple.  Cardiovascular: Normal rate and regular rhythm.   Respiratory: Effort normal and breath sounds normal.  GI: Soft. Bowel sounds are normal.  Musculoskeletal:       Left hip: He exhibits decreased range of motion, decreased strength, bony tenderness and crepitus.  Neurological: He is alert and oriented to person, place, and time.  Skin: Skin is warm and dry.  Psychiatric: He has a normal mood and affect.    Vital signs in last 24 hours: Temp:  [98.7 F (37.1 C)] 98.7 F (37.1 C) (11/22 0600) Pulse Rate:  [75] 75  (11/22 0600) Resp:  [20] 20  (11/22 0600) BP: (124)/(84) 124/84 mmHg (11/22 0600) SpO2:  [99 %] 99 % (11/22 0600)  Labs:   There is no height or weight  on file to calculate BMI.   Imaging Review Plain radiographs demonstrate severe degenerative joint disease of the left hip(s). The bone quality appears to be good for age and reported activity level.  Assessment/Plan:  End stage arthritis, left hip(s)  The patient history, physical examination, clinical judgement of the provider and imaging studies are consistent with end stage degenerative joint disease of the left hip(s) and total hip arthroplasty is deemed medically necessary. The treatment options including medical management, injection therapy, arthroscopy and  arthroplasty were discussed at length. The risks and benefits of total hip arthroplasty were presented and reviewed. The risks due to aseptic loosening, infection, stiffness, dislocation/subluxation,  thromboembolic complications and other imponderables were discussed.  The patient acknowledged the explanation, agreed to proceed with the plan and consent was signed. Patient is being admitted for inpatient treatment for surgery, pain control, PT, OT, prophylactic antibiotics, VTE prophylaxis, progressive ambulation and ADL's and discharge planning.The patient is planning to be discharged home with home health services

## 2012-10-08 LAB — CBC
HCT: 30 % — ABNORMAL LOW (ref 39.0–52.0)
Platelets: 227 10*3/uL (ref 150–400)
RBC: 3.4 MIL/uL — ABNORMAL LOW (ref 4.22–5.81)
RDW: 12.8 % (ref 11.5–15.5)
WBC: 7.2 10*3/uL (ref 4.0–10.5)

## 2012-10-08 LAB — BASIC METABOLIC PANEL
BUN: 11 mg/dL (ref 6–23)
CO2: 30 mEq/L (ref 19–32)
Chloride: 100 mEq/L (ref 96–112)
GFR calc Af Amer: 88 mL/min — ABNORMAL LOW (ref >90)
Potassium: 3.8 mEq/L (ref 3.5–5.1)

## 2012-10-08 NOTE — Progress Notes (Signed)
Physical Therapy Treatment Patient Details Name: Vincent Herrera MRN: [id number] DOB: [birth year] Today's Date: 10/08/2012 Time: 1000-1025 PT Time Calculation (min): 25 min  PT Assessment / Plan / Recommendation Comments on Treatment Session       Follow Up Recommendations  Home health PT     Does the patient have the potential to tolerate intense rehabilitation     Barriers to Discharge        Equipment Recommendations  None recommended by PT    Recommendations for Other Services OT consult  Frequency 7X/week   Plan Discharge plan remains appropriate    Precautions / Restrictions Precautions Precautions: None Restrictions Weight Bearing Restrictions: No Other Position/Activity Restrictions: WBAT   Pertinent Vitals/Pain 5-6/10; premedicated, ice pack provided    Mobility  Bed Mobility Bed Mobility: Supine to Sit Supine to Sit: 1: +2 Total assist Supine to Sit: Patient Percentage: 70% Details for Bed Mobility Assistance: cues for sequence and use of UEs and R LE to self assist Transfers Transfers: Sit to Stand;Stand to Sit Sit to Stand: 1: +2 Total assist Sit to Stand: Patient Percentage: 60% Stand to Sit: 1: +2 Total assist Stand to Sit: Patient Percentage: 70% Details for Transfer Assistance: cues for use of UEs and for LE management Ambulation/Gait Ambulation/Gait Assistance: 1: +2 Total assist Ambulation/Gait: Patient Percentage: 70% Ambulation Distance (Feet): 9 Feet Assistive device: Rolling walker Ambulation/Gait Assistance Details: cues for sequence, posture, position from RW and increased UE WB Gait Pattern: Step-to pattern    Exercises Total Joint Exercises Ankle Circles/Pumps: AROM;10 reps;Both;Supine Quad Sets: AROM;10 reps;Both;Supine Heel Slides: AAROM;10 reps;Supine;Left Hip ABduction/ADduction: AAROM;10 reps;Left;Supine   PT Diagnosis:    PT Problem List:   PT Treatment Interventions:     PT Goals Acute Rehab PT Goals PT Goal Formulation:  With patient Time For Goal Achievement: 10/12/12 Potential to Achieve Goals: Good Pt will go Supine/Side to Sit: with supervision PT Goal: Supine/Side to Sit - Progress: Progressing toward goal Pt will go Sit to Supine/Side: with supervision PT Goal: Sit to Supine/Side - Progress: Progressing toward goal Pt will go Sit to Stand: with supervision PT Goal: Sit to Stand - Progress: Progressing toward goal Pt will go Stand to Sit: with supervision PT Goal: Stand to Sit - Progress: Progressing toward goal Pt will Ambulate: 51 - 150 feet;with supervision;with rolling walker PT Goal: Ambulate - Progress: Progressing toward goal Pt will Go Up / Down Stairs: 3-5 stairs;with min assist;with least restrictive assistive device  Visit Information  Last PT Received On: 10/08/12 Assistance Needed: +2    Subjective Data  Subjective: I'm doing a little better than yesterday Patient Stated Goal: Walk with decreased pain   Cognition  Overall Cognitive Status: Appears within functional limits for tasks assessed/performed Arousal/Alertness: Awake/alert Orientation Level: Appears intact for tasks assessed Behavior During Session: [facility] for tasks performed    Balance     End of Session PT - End of Session Equipment Utilized During Treatment: Gait belt Activity Tolerance: Patient limited by pain;Patient limited by fatigue Patient left: in chair;with call bell/phone within reach Nurse Communication: Mobility status   GP     Vincent Herrera 10/08/2012, 12:24 PM

## 2012-10-08 NOTE — Progress Notes (Signed)
10/08/2012 Jorie Zee,BSN RN CCM Pt was prior authorized for [facility] services with [place]. Genevieve Norlander will provide [facility] services. [facility] intake notified to cancel request for services.

## 2012-10-08 NOTE — Progress Notes (Signed)
Patient ID: Vincent Herrera, male   DOB: May 19, 1956, 55 y.o.   MRN: [id number] PATIENT ID: Vincent Herrera        MRN:  [id number]          DOB/AGE: 10-23-1956 / 56 y.o.    Norlene Campbell, MD   Jacqualine Code, PA-C [address] [place], [place]  [address]                             [phone]   PROGRESS NOTE  Subjective:  negative for Chest Pain  negative for Shortness of Breath  negative for Nausea/Vomiting   negative for Calf Pain    Tolerating Diet: yes         Patient reports pain as mild.     Difficult night with sleep,better this am,good effort in PT  Objective: Vital signs in last 24 hours:   Patient Vitals for the past 24 hrs:  BP Temp Temp src Pulse Resp SpO2  10/08/12 0800 - - - - 18  98 %  10/08/12 0619 130/69 mmHg 100.5 F (38.1 C) - 94  18  98 %  10/08/12 0153 102/64 mmHg 99.3 F (37.4 C) - 91  16  96 %  2012-10-22 2117 105/68 mmHg 100.2 F (37.9 C) Oral 88  16  99 %  10-22-12 1806 116/70 mmHg 100.2 F (37.9 C) Oral 92  16  98 %  10-22-12 1400 122/79 mmHg 98.5 F (36.9 C) - 77  16  98 %  Oct 22, 2012 1255 104/65 mmHg 98.2 F (36.8 C) - 78  16  100 %  10/22/12 1205 117/71 mmHg 98.1 F (36.7 C) - 77  16  100 %  10-22-12 1200 - - - - 16  100 %      Intake/Output from previous day:   Oct 23, 2023 0701 - 11/23 0700 In: 5633.3 [P.O.:1087; I.V.:4441.3] Out: 1855 [Urine:1505]   Intake/Output this shift:   11/23 0701 - 11/23 1900 In: 100  Out: 150 [Urine:150]   Intake/Output      10/23/23 0701 - 11/23 0700 11/23 0701 - 11/24 0700   P.O. 1087    I.V. (mL/kg) 4441.3 (53.5)    Other  100   IV Piggyback 105    Total Intake(mL/kg) 5633.3 (67.9) 100 (1.2)   Urine (mL/kg/hr) 1505 (0.8) 150 (0.4)   Blood 350    Total Output 1855 150   Net +3778.3 -50           LABORATORY DATA:  Basename 10/08/12 0516 10/03/12 1055  WBC 7.2 8.5  HGB 9.9* 15.2  HCT 30.0* 43.9  PLT 227 387    Basename 10/08/12 0516 10/03/12 1055  NA 134* 138  K 3.8 4.8  CL 100 101  CO2 30 30    BUN 11 13  CREATININE 1.06 0.86  GLUCOSE 111* 100*  CALCIUM 8.0* 9.8   Lab Results  Component Value Date   INR 0.98 10/03/2012    Recent Radiographic Studies :  Dg Hip Complete Left  10-22-2012  *RADIOLOGY REPORT*  Clinical Data: If surgery  LEFT HIP - COMPLETE 2+ VIEW  Comparison: None.  Findings: Left total hip arthroplasty has been performed.  Anatomic alignment of the osseous and prosthetic structures.  No breakage or loosening of the hardware.  IMPRESSION: Left total hip arthroplasty anatomically aligned.   Original Report Authenticated By: Jolaine Click, M.D.    Dg Pelvis Portable  22-Oct-2012  *RADIOLOGY REPORT*  Clinical  Data: Postop left hip replacement  PORTABLE PELVIS  Comparison: 04/25/2012  Findings: Portable film at 0944 hours shows the patient to be immediately status post left total hip replacement.  No evidence for hardware complications.  Gas in the soft tissues of the left hip region is compatible with the recent surgery.  Advanced changes of osteoarthritis are seen in the right hip.  IMPRESSION: Status post left total hip replacement without evidence for hardware complications.   Original Report Authenticated By: Kennith Center, M.D.    Dg Hip Portable 1 View Left  10/07/2012  *RADIOLOGY REPORT*  Clinical Data: Postop hip replacement  PORTABLE LEFT HIP - 1 VIEW  Comparison: Intraoperative films from earlier the same day.  Findings: Cross-table lateral view of the left hip obtained portably at 0914 hours shows the patient be status post total hip replacement.  Femoral component is appropriate position within the acetabular cup.  No evidence for immediate hardware complications.  IMPRESSION: Status post left total hip replacement without evidence for immediate hardware complications.   Original Report Authenticated By: Kennith Center, M.D.    Dg C-arm 1-60 Min-no Report  10/07/2012  CLINICAL DATA: anterior left hip   C-ARM 1-60 MINUTES  Fluoroscopy was utilized by the requesting  physician.  No radiographic  interpretation.       Examination:  General appearance: alert and no distress  Wound Exam: clean, dry, intact   Drainage:  None: wound tissue dry  Motor Exam: EHL, FHL, Anterior Tibial and Posterior Tibial Intact  Sensory Exam: Superficial Peroneal, Deep Peroneal and Tibial normal  Vascular Exam: Normal  Assessment:    1 Day Post-Op  Procedure(s) (LRB): TOTAL HIP ARTHROPLASTY ANTERIOR APPROACH (Left)  ADDITIONAL DIAGNOSIS:  Principal Problem:  *Degenerative arthritis of hip  no new problems   Plan: Physical Therapy as ordered Weight Bearing as Tolerated (WBAT)  DVT Prophylaxis:  Aspirin  DISCHARGE PLAN: Home  DISCHARGE NEEDS: HHPT and Walker   monitor lab, KVO IV      Valeria Batman 10/08/2012 11:25 AM

## 2012-10-08 NOTE — Plan of Care (Signed)
Problem: Consults Goal: Diagnosis- Total Joint Replacement Outcome: Completed/Met Date Met:  10/08/12 Primary Total Hip LEFT ANTERIOR

## 2012-10-08 NOTE — Progress Notes (Signed)
Physical Therapy Treatment Patient Details Name: Vincent Herrera MRN: [id number] DOB: 1956/05/02 Today's Date: 10/08/2012 Time: 5621-3086 PT Time Calculation (min): 32 min  PT Assessment / Plan / Recommendation Comments on Treatment Session  Increased time all tasks with pt demonstrating very limited endurance with activity    Follow Up Recommendations  Home health PT     Does the patient have the potential to tolerate intense rehabilitation     Barriers to Discharge        Equipment Recommendations  None recommended by PT    Recommendations for Other Services OT consult  Frequency 7X/week   Plan Discharge plan remains appropriate    Precautions / Restrictions Precautions Precautions: None Restrictions Other Position/Activity Restrictions: WBAT   Pertinent Vitals/Pain     Mobility  Bed Mobility Bed Mobility: Sit to Supine Sit to Supine: 1: +2 Total assist Sit to Supine: Patient Percentage: 60% Details for Bed Mobility Assistance: cues for sequence and use of UEs and R LE to self assist Transfers Transfers: Sit to Stand;Stand to Sit Sit to Stand: 1: +2 Total assist Sit to Stand: Patient Percentage: 60% Stand to Sit: 1: +2 Total assist Stand to Sit: Patient Percentage: 70% Details for Transfer Assistance: cues for use of UEs and for LE management Ambulation/Gait Ambulation/Gait Assistance: 1: +2 Total assist Ambulation/Gait: Patient Percentage: 70% Ambulation Distance (Feet): 9 Feet (and 4') Assistive device: Rolling walker Ambulation/Gait Assistance Details: cues for sequence, posture, position from RW, increased DF with L swing phase Gait Pattern: Step-to pattern    Exercises     PT Diagnosis:    PT Problem List:   PT Treatment Interventions:     PT Goals Acute Rehab PT Goals PT Goal Formulation: With patient Time For Goal Achievement: 10/12/12 Potential to Achieve Goals: Good Pt will go Supine/Side to Sit: with supervision PT Goal: Supine/Side to Sit -  Progress: Progressing toward goal Pt will go Sit to Supine/Side: with supervision PT Goal: Sit to Supine/Side - Progress: Progressing toward goal Pt will go Sit to Stand: with supervision PT Goal: Sit to Stand - Progress: Progressing toward goal Pt will go Stand to Sit: with supervision PT Goal: Stand to Sit - Progress: Progressing toward goal Pt will Ambulate: 51 - 150 feet;with supervision;with rolling walker PT Goal: Ambulate - Progress: Progressing toward goal  Visit Information  Last PT Received On: 10/08/12 Assistance Needed: +2    Subjective Data  Subjective: Lets try Patient Stated Goal: Walk with decreased pain   Cognition  Overall Cognitive Status: Appears within functional limits for tasks assessed/performed Arousal/Alertness: Awake/alert Orientation Level: Appears intact for tasks assessed Behavior During Session: [facility] for tasks performed Cognition - Other Comments: occasionally slow to process cues - ? MeDS    Balance     End of Session     GP     Vincent Herrera 10/08/2012, 2:33 PM

## 2012-10-09 LAB — CBC
HCT: 30.3 % — ABNORMAL LOW (ref 39.0–52.0)
Hemoglobin: 10 g/dL — ABNORMAL LOW (ref 13.0–17.0)
WBC: 9.8 10*3/uL (ref 4.0–10.5)

## 2012-10-09 MED ORDER — DOCUSATE SODIUM 100 MG PO CAPS
100.0000 mg | ORAL_CAPSULE | Freq: Two times a day (BID) | ORAL | Status: DC
Start: 2012-10-09 — End: 2012-10-10
  Administered 2012-10-09 – 2012-10-10 (×2): 100 mg via ORAL

## 2012-10-09 MED ORDER — FLEET ENEMA 7-19 GM/118ML RE ENEM: 1.0000 | ENEMA | Freq: Every day | RECTAL | Status: DC | PRN

## 2012-10-09 MED ORDER — MAGNESIUM HYDROXIDE 400 MG/5ML PO SUSP: 30.0000 mL | Freq: Every day | ORAL | Status: DC | PRN

## 2012-10-09 MED ORDER — SENNOSIDES-DOCUSATE SODIUM 8.6-50 MG PO TABS
1.0000 | ORAL_TABLET | Freq: Every day | ORAL | Status: DC | PRN
  Administered 2012-10-09: 1 via ORAL
  Filled 2012-10-09: qty 1

## 2012-10-09 NOTE — Progress Notes (Signed)
Physical Therapy Treatment Patient Details Name: Vincent Herrera MRN: [id number] DOB: 06-21-1956 Today's Date: 10/09/2012 Time: 4098-1191 PT Time Calculation (min): 33 min  PT Assessment / Plan / Recommendation Comments on Treatment Session  Pt with limited ambulation distance this afternoon due to increased pain.  RN notified and pt given pain meds.  Will practice stairs with pt in am prior to D/C.     Follow Up Recommendations  Home health PT     Does the patient have the potential to tolerate intense rehabilitation     Barriers to Discharge        Equipment Recommendations  3 in 1 bedside comode    Recommendations for Other Services    Frequency 7X/week   Plan Discharge plan remains appropriate    Precautions / Restrictions Precautions Precautions: Fall Restrictions Weight Bearing Restrictions: No Other Position/Activity Restrictions: WBAT   Pertinent Vitals/Pain 7-8/10, RN notified, pt premedicated.     Mobility  Bed Mobility Bed Mobility: Sit to Supine Sit to Supine: 4: Min assist;HOB flat Details for Bed Mobility Assistance: Assist for LLE into bed with cues for hand placement and adjusting hips once in bed.  Transfers Transfers: Sit to Stand;Stand to Sit Sit to Stand: 4: Min assist;With upper extremity assist;With armrests;From chair/3-in-1 Stand to Sit: 4: Min guard;With upper extremity assist;With armrests;To bed;To chair/3-in-1 Details for Transfer Assistance: cues for hand placement, esp when sitting and some assist for LLE when sitting.  Ambulation/Gait Ambulation/Gait Assistance: 4: Min assist Ambulation Distance (Feet): 12 Feet Assistive device: Rolling walker Ambulation/Gait Assistance Details: Cues for sequencing/technique with RW, to maintain upright posture.  Pt limited with distance due to increased pain.  Gait Pattern: Step-to pattern    Exercises Total Joint Exercises Ankle Circles/Pumps: AROM;Both;Supine;20 reps Quad Sets: AROM;10  reps;Both;Supine Heel Slides: AAROM;10 reps;Supine;Left Hip ABduction/ADduction: AAROM;10 reps;Left;Supine   PT Diagnosis:    PT Problem List:   PT Treatment Interventions:     PT Goals Acute Rehab PT Goals PT Goal Formulation: With patient Time For Goal Achievement: 10/12/12 Potential to Achieve Goals: Good Pt will go Sit to Supine/Side: with supervision PT Goal: Sit to Supine/Side - Progress: Progressing toward goal Pt will go Sit to Stand: with supervision PT Goal: Sit to Stand - Progress: Progressing toward goal Pt will go Stand to Sit: with supervision PT Goal: Stand to Sit - Progress: Progressing toward goal Pt will Ambulate: 51 - 150 feet;with supervision;with rolling walker PT Goal: Ambulate - Progress: Progressing toward goal  Visit Information  Last PT Received On: 10/09/12 Assistance Needed: +2    Subjective Data  Subjective: I think I'm done (walking) Patient Stated Goal: Walk with decreased pain   Cognition  Overall Cognitive Status: Appears within functional limits for tasks assessed/performed Arousal/Alertness: Awake/alert Orientation Level: Appears intact for tasks assessed Behavior During Session: [facility] for tasks performed    Balance     End of Session PT - End of Session Equipment Utilized During Treatment: Gait belt Activity Tolerance: Patient limited by fatigue;Patient limited by pain Patient left: in bed;with call bell/phone within reach;with family/visitor present Nurse Communication: Mobility status   GP     Page, Meribeth Mattes 10/09/2012, 5:34 PM

## 2012-10-09 NOTE — Progress Notes (Signed)
   CARE MANAGEMENT NOTE 10/09/2012  Patient:  Vincent Herrera, Vincent Herrera   Account Number:  [id number]  Date Initiated:  10/07/2012  Documentation initiated by:  Colleen Can  Subjective/Objective Assessment:   DX  LEFT ANTERIOR HIP ARTHROPLASTY     Action/Plan:   CM spoke with patient and son. Plans are for patient to return to his home in [place] where son will be caregiver.   Anticipated DC Date:  10/10/2012   Anticipated DC Plan:  HOME W HOME HEALTH SERVICES  In-house referral  NA      DC Planning Services  CM consult      [facility] Choice  HOME HEALTH   Choice offered to / List presented to:  C-4 Adult Children           HH agency  [facility]   Status of service:  In process, will continue to follow Medicare Important Message given?   (If response is "NO", the following Medicare IM given date fields will be blank) Date Medicare IM given:   Date Additional Medicare IM given:    Discharge Disposition:    Per UR Regulation:  Reviewed for med. necessity/level of care/duration of stay  If discussed at Long Length of Stay Meetings, dates discussed:    Comments:   10/09/2012 No DME recommended by PT eval.   CRoyal RN MPH  10/08/2012 Damaris Schooner RN CCM Pt was prior authorized for [facility] services with Genevieve Norlander. Genevieve Norlander will provide [name] services. [facility] intake notified to cancel request for services.

## 2012-10-09 NOTE — Progress Notes (Signed)
Physical Therapy Treatment Patient Details Name: Vincent Herrera MRN: [id number] DOB: [birth year] Today's Date: 10/09/2012 Time: 4098-1191 PT Time Calculation (min): 23 min  PT Assessment / Plan / Recommendation Comments on Treatment Session  Pt making notable improvement with mobility today compared to previous sessions.  Gait speed continues to be decreased, however improved from yesterday.     Follow Up Recommendations  Home health PT     Does the patient have the potential to tolerate intense rehabilitation     Barriers to Discharge        Equipment Recommendations  None recommended by PT    Recommendations for Other Services    Frequency 7X/week   Plan Discharge plan remains appropriate    Precautions / Restrictions Precautions Precautions: None Restrictions Weight Bearing Restrictions: No Other Position/Activity Restrictions: WBAT   Pertinent Vitals/Pain 8/10 pain with ambulation, premedicated, ice packs applied.     Mobility  Transfers Transfers: Sit to Stand;Stand to Sit Sit to Stand: 1: +2 Total assist;With upper extremity assist;From bed Sit to Stand: Patient Percentage: 60% Stand to Sit: 1: +2 Total assist Stand to Sit: Patient Percentage: 70% Details for Transfer Assistance: Cues for UE placement with some assist for LE management when sitting.   Ambulation/Gait Ambulation/Gait Assistance: 1: +2 Total assist Ambulation/Gait: Patient Percentage: 80% Ambulation Distance (Feet): 45 Feet Assistive device: Rolling walker Ambulation/Gait Assistance Details: Continue to provide cues for sequencing/technique with RW and to maintain upright posture throughout.  Overall, pt moving much better today.  Gait Pattern: Step-to pattern Gait velocity: decreased, however improved from yesterday.     Exercises     PT Diagnosis:    PT Problem List:   PT Treatment Interventions:     PT Goals Acute Rehab PT Goals PT Goal Formulation: With patient Time For Goal  Achievement: 10/12/12 Potential to Achieve Goals: Good Pt will go Sit to Stand: with supervision PT Goal: Sit to Stand - Progress: Progressing toward goal Pt will go Stand to Sit: with supervision PT Goal: Stand to Sit - Progress: Progressing toward goal Pt will Ambulate: 51 - 150 feet;with supervision;with rolling walker PT Goal: Ambulate - Progress: Progressing toward goal  Visit Information  Last PT Received On: 10/09/12 Assistance Needed: +2    Subjective Data  Subjective: I want to try and use the bathroom.  Patient Stated Goal: Walk with decreased pain   Cognition  Overall Cognitive Status: Appears within functional limits for tasks assessed/performed Arousal/Alertness: Awake/alert Orientation Level: Appears intact for tasks assessed Behavior During Session: [facility] for tasks performed Cognition - Other Comments: occasionally slow to process cues - ? MeDS    Balance     End of Session PT - End of Session Equipment Utilized During Treatment: Gait belt Activity Tolerance: Patient limited by fatigue Patient left: in chair;with call bell/phone within reach Nurse Communication: Mobility status   GP     Page, Meribeth Mattes 10/09/2012, 9:41 AM

## 2012-10-09 NOTE — Progress Notes (Signed)
PATIENT ID: Vincent Herrera        MRN:  [id number]          DOB/AGE: 02-18-1956 / 56 y.o.    Norlene Campbell, MD   Jacqualine Code, PA-C [address] [place], [place]  [address]                             [phone]   PROGRESS NOTE  Subjective:  negative for Chest Pain  negative for Shortness of Breath  negative for Nausea/Vomiting   negative for Calf Pain    Tolerating Diet: yes         Patient reports pain as mild.     Resting comfortably  Objective: Vital signs in last 24 hours:   Patient Vitals for the past 24 hrs:  BP Temp Temp src Pulse Resp SpO2  10/09/12 0757 - - - - 18  94 %  10/09/12 0559 111/75 mmHg 99.7 F (37.6 C) Oral 86  18  94 %  10/09/12 0300 - 99.7 F (37.6 C) Oral - - -  10/08/12 2230 102/66 mmHg 99.9 F (37.7 C) Oral 96  16  94 %  10/08/12 2019 - 101 F (38.3 C) Oral - - -  10/08/12 2015 - - - - 18  93 %  10/08/12 1840 114/68 mmHg 100.1 F (37.8 C) Oral 94  18  95 %  10/08/12 1510 - - - - 18  94 %  10/08/12 1401 119/75 mmHg 97.6 F (36.4 C) Oral 94  18  92 %  10/08/12 1321 - 99.7 F (37.6 C) Oral - - -  10/08/12 1200 - - - - 18  98 %      Intake/Output from previous day:   11/23 0701 - 11/24 0700 In: 820 [P.O.:720] Out: 1595 [Urine:1595]   Intake/Output this shift:   11/24 0701 - 11/24 1900 In: 240 [P.O.:240] Out: 450 [Urine:450]   Intake/Output      11/23 0701 - 11/24 0700 11/24 0701 - 11/25 0700   P.O. 720 240   I.V. (mL/kg)     Other 100    IV Piggyback     Total Intake(mL/kg) 820 (9.9) 240 (2.9)   Urine (mL/kg/hr) 1595 (0.8) 450 (1.3)   Blood     Total Output 1595 450   Net -775 -210           LABORATORY DATA:  Basename 10/09/12 0443 10/08/12 0516 10/03/12 1055  WBC 9.8 7.2 8.5  HGB 10.0* 9.9* 15.2  HCT 30.3* 30.0* 43.9  PLT 212 227 387    Basename 10/08/12 0516 10/03/12 1055  NA 134* 138  K 3.8 4.8  CL 100 101  CO2 30 30  BUN 11 13  CREATININE 1.06 0.86  GLUCOSE 111* 100*  CALCIUM 8.0* 9.8   Lab  Results  Component Value Date   INR 0.98 10/03/2012    Recent Radiographic Studies :  Dg Hip Complete Left  2012-10-21  *RADIOLOGY REPORT*  Clinical Data: If surgery  LEFT HIP - COMPLETE 2+ VIEW  Comparison: None.  Findings: Left total hip arthroplasty has been performed.  Anatomic alignment of the osseous and prosthetic structures.  No breakage or loosening of the hardware.  IMPRESSION: Left total hip arthroplasty anatomically aligned.   Original Report Authenticated By: Jolaine Click, M.D.    Dg Pelvis Portable  2012/10/21  *RADIOLOGY REPORT*  Clinical Data: Postop left hip replacement  PORTABLE PELVIS  Comparison: 04/25/2012  Findings: Portable film at 0944 hours shows the patient to be immediately status post left total hip replacement.  No evidence for hardware complications.  Gas in the soft tissues of the left hip region is compatible with the recent surgery.  Advanced changes of osteoarthritis are seen in the right hip.  IMPRESSION: Status post left total hip replacement without evidence for hardware complications.   Original Report Authenticated By: Kennith Center, M.D.    Dg Hip Portable 1 View Left  10/07/2012  *RADIOLOGY REPORT*  Clinical Data: Postop hip replacement  PORTABLE LEFT HIP - 1 VIEW  Comparison: Intraoperative films from earlier the same day.  Findings: Cross-table lateral view of the left hip obtained portably at 0914 hours shows the patient be status post total hip replacement.  Femoral component is appropriate position within the acetabular cup.  No evidence for immediate hardware complications.  IMPRESSION: Status post left total hip replacement without evidence for immediate hardware complications.   Original Report Authenticated By: Kennith Center, M.D.    Dg C-arm 1-60 Min-no Report  10/07/2012  CLINICAL DATA: anterior left hip   C-ARM 1-60 MINUTES  Fluoroscopy was utilized by the requesting physician.  No radiographic  interpretation.       Examination:  General  appearance: alert, cooperative and mild distress Resp: clear to auscultation bilaterally Cardio: regular rate and rhythm GI: normal findings: bowel sounds normal  Wound Exam: clean, dry, intact   Drainage:  None: wound tissue dry  Motor Exam: EHL, FHL, Anterior Tibial and Posterior Tibial Intact  Sensory Exam: Superficial Peroneal, Deep Peroneal and Tibial normal  Vascular Exam: Left dorsalis pedis artery has 1+ (weak) pulse  Assessment:    2 Days Post-Op  Procedure(s) (LRB): TOTAL HIP ARTHROPLASTY ANTERIOR APPROACH (Left)  ADDITIONAL DIAGNOSIS:  Principal Problem:  *Degenerative arthritis of hip  Acute Blood Loss Anemia   Plan: Physical Therapy as ordered Weight Bearing as Tolerated (WBAT)  DVT Prophylaxis:  Aspirin  DISCHARGE PLAN: Home  DISCHARGE NEEDS: HHPT, Walker and 3-in-1 comode seat         PETRARCA,BRIAN 10/09/2012 11:20 AM

## 2012-10-09 NOTE — Evaluation (Signed)
Occupational Therapy Evaluation Patient Details Name: Vincent Herrera MRN: [id number] DOB: 30-Jul-1956 Today's Date: 10/09/2012 Time: 4098-1191 OT Time Calculation (min): 37 min  OT Assessment / Plan / Recommendation Clinical Impression  Pt is recovering slowly, but steadily from L THA (direct anterior).  Will follow acutely to address deficit areas so pt may d/c home with family assisting.    OT Assessment  Patient needs continued OT Services    Follow Up Recommendations  Home health OT;Supervision/Assistance - 24 hour    Barriers to Discharge      Equipment Recommendations  3 in 1 bedside comode    Recommendations for Other Services    Frequency  Min 2X/week    Precautions / Restrictions Precautions Precautions: Fall Restrictions Weight Bearing Restrictions: No Other Position/Activity Restrictions: WBAT   Pertinent Vitals/Pain R and L hips with movement, premedicated, iced, repositioned.    ADL  Eating/Feeding: Independent;Simulated Where Assessed - Eating/Feeding: Edge of bed Grooming: Performed;Wash/dry face;Teeth care;Set up Where Assessed - Grooming: Unsupported sitting Upper Body Bathing: Performed;Minimal assistance Where Assessed - Upper Body Bathing: Unsupported sitting Lower Body Bathing: Simulated;Maximal assistance Where Assessed - Lower Body Bathing: Unsupported sitting;Supported sit to stand Upper Body Dressing: Performed;Set up Where Assessed - Upper Body Dressing: Unsupported sitting Lower Body Dressing: Performed;Maximal assistance Where Assessed - Lower Body Dressing: Unsupported sitting;Supported sit to stand Toilet Transfer: Performed;+2 Total assistance Toilet Transfer: Patient Percentage: 70% Toilet Transfer Method: Stand pivot Acupuncturist: Bedside commode Toileting - Clothing Manipulation and Hygiene: Minimal assistance;Performed Where Assessed - Engineer, mining and Hygiene: Standing Equipment Used: Reacher;Rolling  walker;Sock aid;Long-handled sponge;Long-handled shoe horn Transfers/Ambulation Related to ADLs: +2 total assist pt 80 % with RW ADL Comments: Instructed pt in use of AE.  Discussed option of tub transfer bench, pt has a shower seat he can use from his mother if he is able to step over the edge of the tub.    OT Diagnosis: Generalized weakness;Acute pain  OT Problem List: Decreased strength;Impaired balance (sitting and/or standing);Decreased knowledge of use of DME or AE;Pain;Decreased activity tolerance OT Treatment Interventions: Self-care/ADL training;DME and/or AE instruction;Therapeutic activities;Patient/family education   OT Goals Acute Rehab OT Goals OT Goal Formulation: With patient Time For Goal Achievement: 10/16/12 Potential to Achieve Goals: Good ADL Goals Pt Will Perform Grooming: with supervision;Standing at sink ADL Goal: Grooming - Progress: Goal set today Pt Will Perform Lower Body Bathing: with supervision;with adaptive equipment;Sit to stand from bed ADL Goal: Lower Body Bathing - Progress: Goal set today Pt Will Perform Lower Body Dressing: with supervision;Sit to stand from bed;with adaptive equipment ADL Goal: Lower Body Dressing - Progress: Goal set today Pt Will Transfer to Toilet: with supervision;Ambulation;3-in-1;Other (comment) (3 in1 over toilet) ADL Goal: Toilet Transfer - Progress: Goal set today Pt Will Perform Toileting - Clothing Manipulation: Standing;with supervision ADL Goal: Toileting - Clothing Manipulation - Progress: Goal set today Pt Will Perform Toileting - Hygiene: Independently;Sitting on 3-in-1 or toilet ADL Goal: Toileting - Hygiene - Progress: Goal set today Pt Will Perform Tub/Shower Transfer: Tub transfer;with min assist;Ambulation;Shower seat with back ADL Goal: Web designer - Progress: Goal set today Miscellaneous OT Goals Miscellaneous OT Goal #1: Pt will perform bed mobility with supervision in prep for ADL at EOB. OT Goal:  Miscellaneous Goal #1 - Progress: Goal set today  Visit Information  Last OT Received On: 10/09/12 Assistance Needed: +2    Subjective Data  Subjective: "I need to have my other hip done too." Patient Stated Goal: Home with  son's assistance initially.   Prior Functioning     Home Living Lives With: Alone Available Help at Discharge: Family;Other (Comment) (son) Type of Home: Mobile home Home Access: Stairs to enter Entrance Stairs-Number of Steps: 3 Entrance Stairs-Rails: Left Home Layout: One level Bathroom Shower/Tub: Engineer, manufacturing systems: Standard Home Adaptive Equipment: Environmental consultant - rolling;Straight cane Additional Comments: has access to a shower seat Prior Function Level of Independence: Independent with assistive device(s) (was walking with 2 canes) Able to Take Stairs?: Yes Driving: Yes Vocation: Full time employment Communication Communication: No difficulties Dominant Hand: Right         Vision/Perception     Cognition  Overall Cognitive Status: Appears within functional limits for tasks assessed/performed Arousal/Alertness: Awake/alert Orientation Level: Appears intact for tasks assessed Behavior During Session: [facility] for tasks performed Cognition - Other Comments: occasionally slow to process cues - ? MeDS    Extremity/Trunk Assessment Right Upper Extremity Assessment RUE ROM/Strength/Tone: WFL for tasks assessed RUE Coordination: WFL - gross/fine motor Left Upper Extremity Assessment LUE ROM/Strength/Tone: Within functional levels LUE Coordination: WFL - gross/fine motor Trunk Assessment Trunk Assessment: Normal     Mobility Bed Mobility Bed Mobility: Supine to Sit;Sitting - Scoot to Edge of Bed Supine to Sit: 3: Mod assist;With rails;HOB elevated (with trapeze) Sitting - Scoot to Edge of Bed: 5: Supervision;With rail Details for Bed Mobility Assistance: verbal cues for technique and assist for trunk and L LE Transfers Transfers: Sit  to Stand;Stand to Sit Sit to Stand: 1: +2 Total assist;With upper extremity assist;From bed;From chair/3-in-1 Sit to Stand: Patient Percentage: 70% Stand to Sit: With upper extremity assist;To chair/3-in-1 Stand to Sit: Patient Percentage: 80% Details for Transfer Assistance: Cues for UE placement with some assist for LE management when sitting.       Shoulder Instructions     Exercise     Balance Balance Balance Assessed: Yes Static Sitting Balance Static Sitting - Balance Support: Feet supported Static Sitting - Level of Assistance: 5: Stand by assistance Static Sitting - Comment/# of Minutes: 10   End of Session OT - End of Session Activity Tolerance: Patient tolerated treatment well Patient left: Other (comment) (with PT)  GO     Evern Bio 10/09/2012, 11:18 AM [phone]

## 2012-10-10 ENCOUNTER — Encounter (HOSPITAL_COMMUNITY): Payer: Self-pay | Admitting: Orthopaedic Surgery

## 2012-10-10 LAB — CBC
Hemoglobin: 9.6 g/dL — ABNORMAL LOW (ref 13.0–17.0)
MCH: 29 pg (ref 26.0–34.0)
MCV: 86.4 fL (ref 78.0–100.0)
RBC: 3.31 MIL/uL — ABNORMAL LOW (ref 4.22–5.81)

## 2012-10-10 MED ORDER — ASPIRIN 325 MG PO TBEC: 325.0000 mg | DELAYED_RELEASE_TABLET | Freq: Two times a day (BID) | ORAL | Status: DC

## 2012-10-10 MED ORDER — METHOCARBAMOL 750 MG PO TABS: 750.0000 mg | ORAL_TABLET | Freq: Three times a day (TID) | ORAL | Status: DC | PRN

## 2012-10-10 MED ORDER — OXYCODONE-ACETAMINOPHEN 5-325 MG PO TABS: 1.0000 | ORAL_TABLET | ORAL | Status: AC | PRN

## 2012-10-10 NOTE — Discharge Summary (Signed)
Patient ID: Vincent Herrera MRN: [id number] DOB/AGE: 1955/12/25 56 y.o.  Admit date: 10/07/2012 Discharge date: 10/10/2012  Admission Diagnoses:  Principal Problem:  *Degenerative arthritis of hip   Discharge Diagnoses:  Same  Past Medical History  Diagnosis Date  . Arthritis   . History of substance abuse 1991    rehab/ quit 1991  . Anginal pain     anxiety related/ NEG ekg 10 yrs ago per patient, , no further cardiac testing    Surgeries: Procedure(s): TOTAL HIP ARTHROPLASTY ANTERIOR APPROACH on 10/07/2012   Consultants:    Discharged Condition: Improved  Hospital Course: Vincent Herrera is an 56 y.o. male who was admitted 10/07/2012 for operative treatment ofDegenerative arthritis of hip. Patient has severe unremitting pain that affects sleep, daily activities, and work/hobbies. After pre-op clearance the patient was taken to the operating room on 10/07/2012 and underwent  Procedure(s): TOTAL HIP ARTHROPLASTY ANTERIOR APPROACH.    Patient was given perioperative antibiotics: Anti-infectives     Start     Dose/Rate Route Frequency Ordered Stop   10/07/12 1330   ceFAZolin (ANCEF) IVPB 1 g/50 mL premix        1 g 100 mL/hr over 30 Minutes Intravenous Every 6 hours 10/07/12 1139 10/07/12 2023   10/07/12 0535   ceFAZolin (ANCEF) IVPB 2 g/50 mL premix        2 g 100 mL/hr over 30 Minutes Intravenous 60 min pre-op 10/07/12 0535 10/07/12 0724           Patient was given sequential compression devices, early ambulation, and chemoprophylaxis to prevent DVT.  Patient benefited maximally from hospital stay and there were no complications.    Recent vital signs: Patient Vitals for the past 24 hrs:  BP Temp Temp src Pulse Resp SpO2  10/10/12 0538 109/69 mmHg 98.8 F (37.1 C) Oral 75  16  94 %  10/18/2012 2040 111/71 mmHg 99.3 F (37.4 C) Oral 87  16  95 %  2012-10-18 1600 - - - - 18  97 %  Oct 18, 2012 1325 107/58 mmHg 99.4 F (37.4 C) Oral 70  18  97 %  10/18/2012 1200 - - - -  18  98 %  October 18, 2012 0757 - - - - 18  94 %     Recent laboratory studies:  Basename 10/10/12 0420 10-18-2012 0443 10/08/12 0516  WBC 7.2 9.8 --  HGB 9.6* 10.0* --  HCT 28.6* 30.3* --  PLT 220 212 --  NA -- -- 134*  K -- -- 3.8  CL -- -- 100  CO2 -- -- 30  BUN -- -- 11  CREATININE -- -- 1.06  GLUCOSE -- -- 111*  INR -- -- --  CALCIUM -- -- 8.0*     Discharge Medications:     Medication List     As of 10/10/2012  7:12 AM    STOP taking these medications         HYDROcodone-acetaminophen 10-325 MG per tablet   Commonly known as: NORCO      ibuprofen 200 MG tablet   Commonly known as: ADVIL,MOTRIN      TAKE these medications         aspirin 325 MG EC tablet   Take 1 tablet (325 mg total) by mouth 2 (two) times daily.      methocarbamol 750 MG tablet   Commonly known as: ROBAXIN   Take 1 tablet (750 mg total) by mouth 3 (three) times daily as needed. Muscle spasm  oxyCODONE-acetaminophen 5-325 MG per tablet   Commonly known as: PERCOCET/ROXICET   Take 1-2 tablets by mouth every 4 (four) hours as needed for pain.        Diagnostic Studies: Dg Hip Complete Left  10/07/2012  *RADIOLOGY REPORT*  Clinical Data: If surgery  LEFT HIP - COMPLETE 2+ VIEW  Comparison: None.  Findings: Left total hip arthroplasty has been performed.  Anatomic alignment of the osseous and prosthetic structures.  No breakage or loosening of the hardware.  IMPRESSION: Left total hip arthroplasty anatomically aligned.   Original Report Authenticated By: Jolaine Click, M.D.    Dg Pelvis Portable  10/07/2012  *RADIOLOGY REPORT*  Clinical Data: Postop left hip replacement  PORTABLE PELVIS  Comparison: 04/25/2012  Findings: Portable film at 0944 hours shows the patient to be immediately status post left total hip replacement.  No evidence for hardware complications.  Gas in the soft tissues of the left hip region is compatible with the recent surgery.  Advanced changes of osteoarthritis are seen in  the right hip.  IMPRESSION: Status post left total hip replacement without evidence for hardware complications.   Original Report Authenticated By: Kennith Center, M.D.    Dg Hip Portable 1 View Left  10/07/2012  *RADIOLOGY REPORT*  Clinical Data: Postop hip replacement  PORTABLE LEFT HIP - 1 VIEW  Comparison: Intraoperative films from earlier the same day.  Findings: Cross-table lateral view of the left hip obtained portably at 0914 hours shows the patient be status post total hip replacement.  Femoral component is appropriate position within the acetabular cup.  No evidence for immediate hardware complications.  IMPRESSION: Status post left total hip replacement without evidence for immediate hardware complications.   Original Report Authenticated By: Kennith Center, M.D.    Dg C-arm 1-60 Min-no Report  10/07/2012  CLINICAL DATA: anterior left hip   C-ARM 1-60 MINUTES  Fluoroscopy was utilized by the requesting physician.  No radiographic  interpretation.      Disposition: 01-Home or Self Care      Discharge Orders    Future Orders Please Complete By Expires   Diet - low sodium heart healthy      Call MD / Call 911      Comments:   If you experience chest pain or shortness of breath, CALL 911 and be transported to the hospital emergency room.  If you develope a fever above 101 F, pus (white drainage) or increased drainage or redness at the wound, or calf pain, call your surgeon's office.   Constipation Prevention      Comments:   Drink plenty of fluids.  Prune juice may be helpful.  You may use a stool softener, such as Colace (over the counter) 100 mg twice a day.  Use MiraLax (over the counter) for constipation as needed.   Increase activity slowly as tolerated      Discharge instructions      Comments:   Expect left thigh and leg swelling.  Ice as needed. Increase your activities as comfort allows. You can get your actual incision wet in the shower starting 10/12/12; then dry dressing  daily   Discharge patient         Follow-up Information    Follow up with Kathryne Hitch, MD. In 2 weeks.   Contact information:   [address] [place] [address] [phone]           Signed: Kathryne Hitch 10/10/2012, 7:12 AM

## 2012-10-10 NOTE — Progress Notes (Signed)
Physical Therapy Treatment Patient Details Name: Vincent Herrera MRN: [id number] DOB: [birth year] Today's Date: 10/10/2012 Time: 7829-5621 PT Time Calculation (min): 50 min  PT Assessment / Plan / Recommendation Comments on Treatment Session  progressing well    Follow Up Recommendations  Home health PT     Does the patient have the potential to tolerate intense rehabilitation     Barriers to Discharge        Equipment Recommendations       Recommendations for Other Services    Frequency 7X/week   Plan Discharge plan remains appropriate    Precautions / Restrictions Precautions Precautions: Fall Restrictions Other Position/Activity Restrictions: WBAT   Pertinent Vitals/Pain     Mobility  Bed Mobility Bed Mobility: Sit to Supine Supine to Sit: 3: Mod assist;HOB flat Sitting - Scoot to Edge of Bed: 5: Supervision Details for Bed Mobility Assistance: assist for UB and LLE, cues for technique Transfers Transfers: Sit to Stand;Stand to Sit Sit to Stand: 4: Min guard;From chair/3-in-1;From bed;With upper extremity assist Stand to Sit: 4: Min guard;5: Supervision;To chair/3-in-1 Details for Transfer Assistance: cues for hand placement, esp when sitting and some assist for LLE when sitting.  Ambulation/Gait Ambulation/Gait Assistance: 4: Min guard Ambulation Distance (Feet): 100 Feet (times 2 and 10'x2) Assistive device: Rolling walker Ambulation/Gait Assistance Details: with RW, to maintain upright posture Gait Pattern: Step-to pattern Gait velocity: decreased Stairs: Yes Stairs Assistance: 4: Min assist Stairs Assistance Details (indicate cue type and reason): cues for sequence and technique Stair Management Technique: One rail Right;With crutches;Forwards;Step to pattern Number of Stairs: 4     Exercises Total Joint Exercises Ankle Circles/Pumps: AROM;Both;Supine;20 reps Heel Slides: AAROM;10 reps;Left;Seated   PT Diagnosis:    PT Problem List:   PT Treatment  Interventions:     PT Goals Acute Rehab PT Goals Time For Goal Achievement: 10/12/12 Potential to Achieve Goals: Good Pt will go Supine/Side to Sit: with supervision PT Goal: Supine/Side to Sit - Progress: Progressing toward goal Pt will go Sit to Stand: with supervision PT Goal: Sit to Stand - Progress: Progressing toward goal Pt will go Stand to Sit: with supervision PT Goal: Stand to Sit - Progress: Progressing toward goal Pt will Ambulate: 51 - 150 feet;with supervision;with rolling walker PT Goal: Ambulate - Progress: Progressing toward goal Pt will Go Up / Down Stairs: 3-5 stairs;with min assist;with least restrictive assistive device PT Goal: Up/Down Stairs - Progress: Met  Visit Information  Last PT Received On: 10/10/12 Assistance Needed: +1    Subjective Data  Subjective: I feel better   Cognition  Overall Cognitive Status: Appears within functional limits for tasks assessed/performed Arousal/Alertness: Awake/alert Orientation Level: Appears intact for tasks assessed Behavior During Session: [facility] for tasks performed    Balance     End of Session PT - End of Session Equipment Utilized During Treatment: Gait belt Activity Tolerance: Patient tolerated treatment well Patient left: in chair;with call bell/phone within reach   GP     [facility] 10/10/2012, 10:01 AM

## 2012-10-10 NOTE — Progress Notes (Signed)
Patient ID: Vincent Herrera, male   DOB: April 20, 1956, 56 y.o.   MRN: [id number] Looks good.  Can discahrge to home today.

## 2012-10-10 NOTE — Progress Notes (Signed)
Occupational Therapy Treatment Patient Details Name: Vincent Herrera MRN: [id number] DOB: 1955-12-11 Today's Date: 10/10/2012 Time: 7829-5621 OT Time Calculation (min): 27 min  OT Assessment / Plan / Recommendation Comments on Treatment Session Pt tolerated session well. Additional AE education provided and pt has agreed to let [employer] assess shower/tub transfer before attempting.     Follow Up Recommendations  Home health OT;Supervision/Assistance - 24 hour    Barriers to Discharge       Equipment Recommendations  3 in 1 bedside comode;Other (comment) (if pt doesnt have access to one. he states he has one now??)    Recommendations for Other Services    Frequency Min 2X/week   Plan Discharge plan remains appropriate    Precautions / Restrictions Precautions Precautions: Fall Restrictions Weight Bearing Restrictions: No Other Position/Activity Restrictions: WBAT       ADL  Lower Body Dressing: Performed;Minimal assistance;Other (comment) (used reacher to don sweatpants, sock aid to don socks) Where Assessed - Lower Body Dressing: Supported sit to Pharmacist, hospital: Counsellor Method: Sit to stand Equipment Used: Rolling walker;Long-handled sponge;Long-handled shoe horn;Sock aid;Reacher ADL Comments: Demonstrated all AE and pt used sock aid to don socks and reacher to start pants over feet. Pt verbalizes understanding of all AE use/coverage. Did well with AE today. Discussed at length shower chair options of seat versus bench. He states he has a 3in1 but NOT a shower seat. Pt thinks there isnt enough room for a tubbench and  thinks a tubseat is adequate. He has agreed to let [facility] assess shower setup/space before attempting shower transfer on his own. Pt still with pain and stiffness in L hip which would make it difficult to step over the tub currently.     OT Diagnosis:    OT Problem List:   OT Treatment Interventions:     OT Goals ADL Goals ADL Goal:  Lower Body Dressing - Progress: Progressing toward goals ADL Goal: Toilet Transfer - Progress: Progressing toward goals  Visit Information  Last OT Received On: 10/10/12 Assistance Needed: +1    Subjective Data  Subjective: I really need my hair washed Patient Stated Goal: wants to be able to shower and get hair washed   Prior Functioning       Cognition  Overall Cognitive Status: Appears within functional limits for tasks assessed/performed Arousal/Alertness: Awake/alert Orientation Level: Appears intact for tasks assessed Behavior During Session: [facility] Advocate for tasks performed    Mobility  Shoulder Instructions Bed Mobility Bed Mobility: Sit to Supine Supine to Sit: 3: Mod assist;HOB flat Sitting - Scoot to Edge of Bed: 5: Supervision Details for Bed Mobility Assistance: assist for UB and LLE, cues for technique Transfers Transfers: Sit to Stand;Stand to Sit Sit to Stand: 4: Min guard;With upper extremity assist;From chair/3-in-1 Stand to Sit: 4: Min guard;With upper extremity assist;To chair/3-in-1 Details for Transfer Assistance: verbal cues for hand placement          Balance Balance Balance Assessed: Yes Dynamic Standing Balance Dynamic Standing - Level of Assistance: 4: Min assist (to pull up pants)   End of Session OT - End of Session Activity Tolerance: Patient tolerated treatment well Patient left: in chair;with call bell/phone within reach  GO     Lennox Laity [phone] 10/10/2012, 11:43 AM

## 2012-10-10 NOTE — Progress Notes (Signed)
CARE MANAGEMENT NOTE 10/10/2012  Patient:  Vincent Herrera, Vincent Herrera   Account Number:  [id number]  Date Initiated:  10/07/2012  Documentation initiated by:  Colleen Can  Subjective/Objective Assessment:   DX  LEFT ANTERIOR HIP ARTHROPLASTY     Action/Plan:   CM spoke with patient and son. Plans are for patient to return to his home in [place] where son will be caregiver.   Anticipated DC Date:  10/10/2012   Anticipated DC Plan:  HOME W HOME HEALTH SERVICES  In-house referral  NA      DC Planning Services  CM consult      [facility] Choice  HOME HEALTH   Choice offered to / List presented to:  C-4 Adult Children   DME arranged  NA      DME agency  NA     HH arranged  HH-2 PT      HH agency  [facility]   Status of service:  Completed, signed off Medicare Important Message given?  Initial given by admissions (If response is "NO", the following Medicare IM given date fields will be blank) Date Medicare IM given:   Date Additional Medicare IM given:    Discharge Disposition:  HOME W HOME HEALTH SERVICES  Per UR Regulation:  Reviewed for med. necessity/level of care/duration of stay    Comments:  10/10/2012 Colleen Can BSN RN CCM [phone] Pt discharged today with [facility] services in place. Services for HHpt to start tomorrow 10/11/2012. [facility] contact information given to patient.

## 2012-10-10 NOTE — Progress Notes (Signed)
Discharge summary sent to payer through [facility]  

## 2013-01-10 ENCOUNTER — Other Ambulatory Visit (HOSPITAL_COMMUNITY): Payer: Self-pay | Admitting: Orthopaedic Surgery

## 2013-01-18 ENCOUNTER — Encounter (HOSPITAL_COMMUNITY): Payer: Self-pay | Admitting: Pharmacy Technician

## 2013-01-26 ENCOUNTER — Encounter (HOSPITAL_COMMUNITY): Payer: Self-pay

## 2013-01-26 ENCOUNTER — Ambulatory Visit (HOSPITAL_COMMUNITY)
Admission: RE | Admit: 2013-01-26 | Discharge: 2013-01-26 | Disposition: A | Discharge status: Home / Self Care | Payer: Commercial Managed Care - PPO | Source: Ambulatory Visit | Attending: Orthopaedic Surgery | Admitting: Orthopaedic Surgery

## 2013-01-26 ENCOUNTER — Encounter (HOSPITAL_COMMUNITY)
Admission: RE | Admit: 2013-01-26 | Discharge: 2013-01-26 | Disposition: A | Discharge status: Home / Self Care | Payer: Commercial Managed Care - PPO | Source: Ambulatory Visit | Attending: Orthopaedic Surgery | Admitting: Orthopaedic Surgery

## 2013-01-26 DIAGNOSIS — Z01812 Encounter for preprocedural laboratory examination: Secondary | ICD-10-CM | POA: Insufficient documentation

## 2013-01-26 DIAGNOSIS — Z01818 Encounter for other preprocedural examination: Secondary | ICD-10-CM | POA: Insufficient documentation

## 2013-01-26 HISTORY — DX: Frequency of micturition: R35.0

## 2013-01-26 HISTORY — DX: Other specified health status: Z78.9

## 2013-01-26 HISTORY — DX: Gastro-esophageal reflux disease without esophagitis: K21.9

## 2013-01-26 LAB — BASIC METABOLIC PANEL
CO2: 31 mEq/L (ref 19–32)
Chloride: 99 mEq/L (ref 96–112)
Potassium: 4.3 mEq/L (ref 3.5–5.1)
Sodium: 137 mEq/L (ref 135–145)

## 2013-01-26 LAB — CBC
HCT: 47.3 % (ref 39.0–52.0)
Hemoglobin: 15.9 g/dL (ref 13.0–17.0)
MCV: 86 fL (ref 78.0–100.0)
Platelets: 364 10*3/uL (ref 150–400)
RBC: 5.5 MIL/uL (ref 4.22–5.81)
WBC: 9.7 10*3/uL (ref 4.0–10.5)

## 2013-01-26 LAB — URINALYSIS, ROUTINE W REFLEX MICROSCOPIC
Bilirubin Urine: NEGATIVE
Glucose, UA: NEGATIVE mg/dL
Hgb urine dipstick: NEGATIVE
Nitrite: NEGATIVE
Specific Gravity, Urine: 1.031 — ABNORMAL HIGH (ref 1.005–1.030)
pH: 5 (ref 5.0–8.0)

## 2013-01-26 LAB — APTT: aPTT: 31 seconds (ref 24–37)

## 2013-01-26 LAB — SURGICAL PCR SCREEN: MRSA, PCR: NEGATIVE

## 2013-01-26 LAB — PROTIME-INR: INR: 0.94 (ref 0.00–1.49)

## 2013-01-26 NOTE — Progress Notes (Signed)
01/26/13 1400  OBSTRUCTIVE SLEEP APNEA  Have you ever been diagnosed with sleep apnea through a sleep study? No  Do you snore loudly (loud enough to be heard through closed doors)?  1  Do you often feel tired, fatigued, or sleepy during the daytime? 1  Has anyone observed you stop breathing during your sleep? 0  Do you have, or are you being treated for high blood pressure? 0  BMI more than 35 kg/m2? 0  Age over 58 years old? 1  Neck circumference greater than 40 cm/18 inches? 0  Gender: 1  Obstructive Sleep Apnea Score 4  Score 4 or greater  Results sent to PCP (FAXED TO DR. LAIZURE)

## 2013-01-26 NOTE — Pre-Procedure Instructions (Signed)
EKG REPORT IN EPIC FROM 10/03/12.   PT HAS NOT HAD ANY RECENT CXR--REPORTS SMOKING HX 20+ YRS AND FAMILY HX OF LUNG CANCER AND PT HAS COUGH.  CXR WAS DONE TODAY- PREOP - AT [facility].

## 2013-01-26 NOTE — Patient Instructions (Addendum)
YOUR SURGERY IS SCHEDULED AT [facility]  ON:  Friday  3/21  REPORT TO [facility] AT: 7:15 AM      PHONE # FOR SHORT STAY IS [phone]  DO NOT EAT OR DRINK ANYTHING AFTER MIDNIGHT THE NIGHT BEFORE YOUR SURGERY.  YOU MAY BRUSH YOUR TEETH, RINSE OUT YOUR MOUTH--BUT NO WATER, NO FOOD, NO CHEWING GUM, NO MINTS, NO CANDIES, NO CHEWING TOBACCO.  YOU MAY TAKE YOUR HYDROCODONE / ACETAMINOPHEN FOR PAIN THE AM OF YOUR SURGERY WITH A SIP OF WATER.  DO NOT BRING VALUABLES, MONEY, CREDIT CARDS.  DO NOT WEAR JEWELRY, MAKE-UP, NAIL POLISH AND NO METAL PINS OR CLIPS IN YOUR HAIR. CONTACT LENS, DENTURES / PARTIALS, GLASSES SHOULD NOT BE WORN TO SURGERY AND IN MOST CASES-HEARING AIDS WILL NEED TO BE REMOVED.  BRING YOUR GLASSES CASE, ANY EQUIPMENT NEEDED FOR YOUR CONTACT LENS. FOR PATIENTS ADMITTED TO THE HOSPITAL--CHECK OUT TIME THE DAY OF DISCHARGE IS 11:00 AM.  ALL INPATIENT ROOMS ARE PRIVATE - WITH BATHROOM, TELEPHONE, TELEVISION AND WIFI INTERNET.                              PLEASE READ OVER ANY  FACT SHEETS THAT YOU WERE GIVEN: MRSA INFORMATION. FAILURE TO FOLLOW THESE INSTRUCTIONS MAY RESULT IN THE CANCELLATION OF YOUR SURGERY.   PATIENT SIGNATURE_________________________________

## 2013-01-27 NOTE — Pre-Procedure Instructions (Signed)
Note faxed to Dr. Alben Spittle office to remind him pt is Jehovah's witness -pt signed refusal for blood.

## 2013-02-03 ENCOUNTER — Inpatient Hospital Stay (HOSPITAL_COMMUNITY): Payer: Commercial Managed Care - PPO

## 2013-02-03 ENCOUNTER — Inpatient Hospital Stay (HOSPITAL_COMMUNITY)
Admission: RE | Admit: 2013-02-03 | Discharge: 2013-02-06 | DRG: 470 | Disposition: A | Discharge status: Home Health | Payer: Commercial Managed Care - PPO | Source: Ambulatory Visit | Attending: Orthopaedic Surgery | Admitting: Orthopaedic Surgery

## 2013-02-03 ENCOUNTER — Encounter (HOSPITAL_COMMUNITY): Payer: Self-pay | Admitting: Anesthesiology

## 2013-02-03 ENCOUNTER — Ambulatory Visit (HOSPITAL_COMMUNITY): Payer: Commercial Managed Care - PPO

## 2013-02-03 ENCOUNTER — Encounter (HOSPITAL_COMMUNITY)
Admission: RE | Disposition: A | Discharge status: Home Health | Payer: Self-pay | Source: Ambulatory Visit | Attending: Orthopaedic Surgery

## 2013-02-03 ENCOUNTER — Ambulatory Visit (HOSPITAL_COMMUNITY): Payer: Commercial Managed Care - PPO | Admitting: Anesthesiology

## 2013-02-03 ENCOUNTER — Encounter (HOSPITAL_COMMUNITY): Payer: Self-pay | Admitting: *Deleted

## 2013-02-03 DIAGNOSIS — K219 Gastro-esophageal reflux disease without esophagitis: Secondary | ICD-10-CM | POA: Diagnosis present

## 2013-02-03 DIAGNOSIS — D62 Acute posthemorrhagic anemia: Secondary | ICD-10-CM | POA: Diagnosis not present

## 2013-02-03 DIAGNOSIS — M169 Osteoarthritis of hip, unspecified: Secondary | ICD-10-CM | POA: Diagnosis present

## 2013-02-03 DIAGNOSIS — F17200 Nicotine dependence, unspecified, uncomplicated: Secondary | ICD-10-CM | POA: Diagnosis present

## 2013-02-03 DIAGNOSIS — R350 Frequency of micturition: Secondary | ICD-10-CM | POA: Diagnosis present

## 2013-02-03 DIAGNOSIS — M1610 Unilateral primary osteoarthritis, unspecified hip: Principal | ICD-10-CM | POA: Diagnosis present

## 2013-02-03 HISTORY — PX: TOTAL HIP ARTHROPLASTY: SHX124

## 2013-02-03 SURGERY — ARTHROPLASTY, HIP, TOTAL, ANTERIOR APPROACH
Anesthesia: General | Site: Hip | Laterality: Right | Wound class: Clean

## 2013-02-03 MED ORDER — GLYCOPYRROLATE 0.2 MG/ML IJ SOLN
INTRAMUSCULAR | Status: DC | PRN
  Administered 2013-02-03: .7 mg via INTRAVENOUS

## 2013-02-03 MED ORDER — METOCLOPRAMIDE HCL 10 MG PO TABS: 5.0000 mg | ORAL_TABLET | Freq: Three times a day (TID) | ORAL | Status: DC | PRN

## 2013-02-03 MED ORDER — SODIUM CHLORIDE 0.9 % IJ SOLN: 9.0000 mL | INTRAMUSCULAR | Status: DC | PRN

## 2013-02-03 MED ORDER — ACETAMINOPHEN 10 MG/ML IV SOLN
INTRAVENOUS | Status: DC | PRN
  Administered 2013-02-03: 1000 mg via INTRAVENOUS

## 2013-02-03 MED ORDER — OXYCODONE HCL 5 MG/5ML PO SOLN: 5.0000 mg | Freq: Once | ORAL | Status: DC | PRN

## 2013-02-03 MED ORDER — OXYCODONE HCL ER 20 MG PO T12A
20.0000 mg | EXTENDED_RELEASE_TABLET | Freq: Two times a day (BID) | ORAL | Status: DC
  Administered 2013-02-03 – 2013-02-04 (×4): 20 mg via ORAL
  Filled 2013-02-03 (×4): qty 1

## 2013-02-03 MED ORDER — 0.9 % SODIUM CHLORIDE (POUR BTL) OPTIME
TOPICAL | Status: DC | PRN
  Administered 2013-02-03: 1000 mL

## 2013-02-03 MED ORDER — CEFAZOLIN SODIUM-DEXTROSE 2-3 GM-% IV SOLR
INTRAVENOUS | Status: AC
  Filled 2013-02-03: qty 50

## 2013-02-03 MED ORDER — CEFAZOLIN SODIUM-DEXTROSE 2-3 GM-% IV SOLR
2.0000 g | INTRAVENOUS | Status: AC
  Administered 2013-02-03: 2 g via INTRAVENOUS

## 2013-02-03 MED ORDER — ZOLPIDEM TARTRATE 5 MG PO TABS: 5.0000 mg | ORAL_TABLET | Freq: Every evening | ORAL | Status: DC | PRN

## 2013-02-03 MED ORDER — DOCUSATE SODIUM 100 MG PO CAPS
100.0000 mg | ORAL_CAPSULE | Freq: Two times a day (BID) | ORAL | Status: DC
  Administered 2013-02-03 – 2013-02-06 (×7): 100 mg via ORAL
  Filled 2013-02-03 (×8): qty 1

## 2013-02-03 MED ORDER — ONDANSETRON HCL 4 MG/2ML IJ SOLN
4.0000 mg | Freq: Four times a day (QID) | INTRAMUSCULAR | Status: DC | PRN
  Administered 2013-02-04 – 2013-02-05 (×3): 4 mg via INTRAVENOUS
  Filled 2013-02-03 (×4): qty 2

## 2013-02-03 MED ORDER — ONDANSETRON HCL 4 MG/2ML IJ SOLN
INTRAMUSCULAR | Status: DC | PRN
  Administered 2013-02-03 (×2): 2 mg via INTRAVENOUS

## 2013-02-03 MED ORDER — ASPIRIN EC 325 MG PO TBEC
325.0000 mg | DELAYED_RELEASE_TABLET | Freq: Every day | ORAL | Status: DC
  Administered 2013-02-04 – 2013-02-06 (×3): 325 mg via ORAL
  Filled 2013-02-03 (×6): qty 1

## 2013-02-03 MED ORDER — HYDROMORPHONE HCL PF 1 MG/ML IJ SOLN
INTRAMUSCULAR | Status: DC | PRN
  Administered 2013-02-03 (×2): 1 mg via INTRAVENOUS

## 2013-02-03 MED ORDER — HYDROMORPHONE 0.3 MG/ML IV SOLN
INTRAVENOUS | Status: AC
  Filled 2013-02-03: qty 25

## 2013-02-03 MED ORDER — ACETAMINOPHEN 325 MG PO TABS
650.0000 mg | ORAL_TABLET | Freq: Four times a day (QID) | ORAL | Status: DC | PRN
  Administered 2013-02-06: 650 mg via ORAL
  Filled 2013-02-03: qty 2

## 2013-02-03 MED ORDER — LIDOCAINE HCL (CARDIAC) 20 MG/ML IV SOLN
INTRAVENOUS | Status: DC | PRN
  Administered 2013-02-03: 50 mg via INTRAVENOUS

## 2013-02-03 MED ORDER — ROCURONIUM BROMIDE 100 MG/10ML IV SOLN
INTRAVENOUS | Status: DC | PRN
  Administered 2013-02-03: 40 mg via INTRAVENOUS
  Administered 2013-02-03 (×2): 10 mg via INTRAVENOUS

## 2013-02-03 MED ORDER — MENTHOL 3 MG MT LOZG: 1.0000 | LOZENGE | OROMUCOSAL | Status: DC | PRN

## 2013-02-03 MED ORDER — FENTANYL CITRATE 0.05 MG/ML IJ SOLN
INTRAMUSCULAR | Status: DC | PRN
  Administered 2013-02-03: 50 ug via INTRAVENOUS
  Administered 2013-02-03 (×2): 25 ug via INTRAVENOUS
  Administered 2013-02-03: 50 ug via INTRAVENOUS
  Administered 2013-02-03: 25 ug via INTRAVENOUS
  Administered 2013-02-03: 100 ug via INTRAVENOUS
  Administered 2013-02-03: 50 ug via INTRAVENOUS
  Administered 2013-02-03: 25 ug via INTRAVENOUS

## 2013-02-03 MED ORDER — ACETAMINOPHEN 650 MG RE SUPP: 650.0000 mg | Freq: Four times a day (QID) | RECTAL | Status: DC | PRN

## 2013-02-03 MED ORDER — HYDROMORPHONE HCL PF 1 MG/ML IJ SOLN
INTRAMUSCULAR | Status: AC
  Filled 2013-02-03: qty 1

## 2013-02-03 MED ORDER — OXYCODONE HCL 5 MG PO TABS
5.0000 mg | ORAL_TABLET | ORAL | Status: DC | PRN
  Administered 2013-02-03 – 2013-02-06 (×10): 10 mg via ORAL
  Filled 2013-02-03 (×2): qty 2
  Filled 2013-02-03: qty 1
  Filled 2013-02-03 (×7): qty 2
  Filled 2013-02-03: qty 1
  Filled 2013-02-03: qty 2

## 2013-02-03 MED ORDER — SODIUM CHLORIDE 0.9 % IV SOLN
INTRAVENOUS | Status: DC | PRN
  Administered 2013-02-03: 1000 mL via INTRAMUSCULAR

## 2013-02-03 MED ORDER — FERROUS SULFATE 325 (65 FE) MG PO TABS
325.0000 mg | ORAL_TABLET | Freq: Three times a day (TID) | ORAL | Status: DC
  Administered 2013-02-03 – 2013-02-05 (×5): 325 mg via ORAL
  Filled 2013-02-03 (×8): qty 1

## 2013-02-03 MED ORDER — HYDROMORPHONE HCL PF 1 MG/ML IJ SOLN
1.0000 mg | INTRAMUSCULAR | Status: DC | PRN
Start: 2013-02-03 — End: 2013-02-05
  Administered 2013-02-03 – 2013-02-04 (×3): 1 mg via INTRAVENOUS
  Filled 2013-02-03 (×4): qty 1

## 2013-02-03 MED ORDER — HYDROMORPHONE 0.3 MG/ML IV SOLN
INTRAVENOUS | Status: DC
  Administered 2013-02-03: 12:00:00 via INTRAVENOUS

## 2013-02-03 MED ORDER — DIPHENHYDRAMINE HCL 12.5 MG/5ML PO ELIX: 12.5000 mg | ORAL_SOLUTION | ORAL | Status: DC | PRN

## 2013-02-03 MED ORDER — METHOCARBAMOL 100 MG/ML IJ SOLN: 500.0000 mg | Freq: Four times a day (QID) | INTRAVENOUS | Status: DC | PRN

## 2013-02-03 MED ORDER — OXYCODONE HCL 5 MG PO TABS: 5.0000 mg | ORAL_TABLET | Freq: Once | ORAL | Status: DC | PRN

## 2013-02-03 MED ORDER — SODIUM CHLORIDE 0.9 % IV SOLN
INTRAVENOUS | Status: DC
  Administered 2013-02-03: 1000 mL via INTRAVENOUS
  Administered 2013-02-04 – 2013-02-05 (×3): via INTRAVENOUS

## 2013-02-03 MED ORDER — STERILE WATER FOR IRRIGATION IR SOLN
Status: DC | PRN
  Administered 2013-02-03: 3000 mL

## 2013-02-03 MED ORDER — HYDROMORPHONE HCL PF 1 MG/ML IJ SOLN
0.2500 mg | INTRAMUSCULAR | Status: DC | PRN
  Administered 2013-02-03 (×4): 0.5 mg via INTRAVENOUS

## 2013-02-03 MED ORDER — MIDAZOLAM HCL 5 MG/5ML IJ SOLN
INTRAMUSCULAR | Status: DC | PRN
  Administered 2013-02-03: 2 mg via INTRAVENOUS

## 2013-02-03 MED ORDER — LACTATED RINGERS IV SOLN
INTRAVENOUS | Status: DC
  Administered 2013-02-03: 11:00:00 via INTRAVENOUS
  Administered 2013-02-03: 1000 mL via INTRAVENOUS

## 2013-02-03 MED ORDER — ONDANSETRON HCL 4 MG/2ML IJ SOLN: 4.0000 mg | Freq: Four times a day (QID) | INTRAMUSCULAR | Status: DC | PRN

## 2013-02-03 MED ORDER — METHOCARBAMOL 500 MG PO TABS
500.0000 mg | ORAL_TABLET | Freq: Four times a day (QID) | ORAL | Status: DC | PRN
  Administered 2013-02-03 – 2013-02-05 (×4): 500 mg via ORAL
  Filled 2013-02-03 (×4): qty 1

## 2013-02-03 MED ORDER — NICOTINE 21 MG/24HR TD PT24
21.0000 mg | MEDICATED_PATCH | Freq: Every day | TRANSDERMAL | Status: DC
  Administered 2013-02-03 – 2013-02-06 (×4): 21 mg via TRANSDERMAL
  Filled 2013-02-03 (×4): qty 1

## 2013-02-03 MED ORDER — PHENOL 1.4 % MT LIQD: 1.0000 | OROMUCOSAL | Status: DC | PRN

## 2013-02-03 MED ORDER — LABETALOL HCL 5 MG/ML IV SOLN
INTRAVENOUS | Status: DC | PRN
  Administered 2013-02-03: 5 mg via INTRAVENOUS

## 2013-02-03 MED ORDER — PROPOFOL 10 MG/ML IV BOLUS
INTRAVENOUS | Status: DC | PRN
  Administered 2013-02-03: 150 mg via INTRAVENOUS

## 2013-02-03 MED ORDER — DIPHENHYDRAMINE HCL 12.5 MG/5ML PO ELIX: 12.5000 mg | ORAL_SOLUTION | Freq: Four times a day (QID) | ORAL | Status: DC | PRN

## 2013-02-03 MED ORDER — DIPHENHYDRAMINE HCL 50 MG/ML IJ SOLN: 12.5000 mg | Freq: Four times a day (QID) | INTRAMUSCULAR | Status: DC | PRN

## 2013-02-03 MED ORDER — ACETAMINOPHEN 10 MG/ML IV SOLN
INTRAVENOUS | Status: AC
  Filled 2013-02-03: qty 100

## 2013-02-03 MED ORDER — ONDANSETRON HCL 4 MG PO TABS: 4.0000 mg | ORAL_TABLET | Freq: Four times a day (QID) | ORAL | Status: DC | PRN

## 2013-02-03 MED ORDER — METOCLOPRAMIDE HCL 5 MG/ML IJ SOLN
5.0000 mg | Freq: Three times a day (TID) | INTRAMUSCULAR | Status: DC | PRN
  Administered 2013-02-05 (×2): 10 mg via INTRAVENOUS
  Filled 2013-02-03 (×2): qty 2

## 2013-02-03 MED ORDER — METOCLOPRAMIDE HCL 5 MG/ML IJ SOLN: 10.0000 mg | Freq: Once | INTRAMUSCULAR | Status: DC | PRN

## 2013-02-03 MED ORDER — ALUM & MAG HYDROXIDE-SIMETH 200-200-20 MG/5ML PO SUSP: 30.0000 mL | ORAL | Status: DC | PRN

## 2013-02-03 MED ORDER — CEFAZOLIN SODIUM 1-5 GM-% IV SOLN
1.0000 g | Freq: Four times a day (QID) | INTRAVENOUS | Status: AC
  Administered 2013-02-03 (×2): 1 g via INTRAVENOUS
  Filled 2013-02-03 (×2): qty 50

## 2013-02-03 MED ORDER — NEOSTIGMINE METHYLSULFATE 1 MG/ML IJ SOLN
INTRAMUSCULAR | Status: DC | PRN
  Administered 2013-02-03: 4 mg via INTRAVENOUS

## 2013-02-03 MED ORDER — NALOXONE HCL 0.4 MG/ML IJ SOLN: 0.4000 mg | INTRAMUSCULAR | Status: DC | PRN

## 2013-02-03 SURGICAL SUPPLY — 39 items
BAG ZIPLOCK 12X15 (MISCELLANEOUS) ×4 IMPLANT
BLADE SAW SGTL 18X1.27X75 (BLADE) ×2 IMPLANT
CELLS DAT CNTRL 66122 CELL SVR (MISCELLANEOUS) ×1 IMPLANT
CLOTH BEACON ORANGE TIMEOUT ST (SAFETY) ×2 IMPLANT
DERMABOND ADVANCED (GAUZE/BANDAGES/DRESSINGS) ×1
DERMABOND ADVANCED .7 DNX12 (GAUZE/BANDAGES/DRESSINGS) ×1 IMPLANT
DRAPE C-ARM 42X72 X-RAY (DRAPES) ×2 IMPLANT
DRAPE STERI IOBAN 125X83 (DRAPES) ×2 IMPLANT
DRAPE U-SHAPE 47X51 STRL (DRAPES) ×6 IMPLANT
DRSG AQUACEL AG ADV 3.5X10 (GAUZE/BANDAGES/DRESSINGS) ×2 IMPLANT
DURAPREP 26ML APPLICATOR (WOUND CARE) ×2 IMPLANT
ELECT BLADE TIP CTD 4 INCH (ELECTRODE) ×2 IMPLANT
ELECT REM PT RETURN 9FT ADLT (ELECTROSURGICAL) ×2
ELECTRODE REM PT RTRN 9FT ADLT (ELECTROSURGICAL) ×1 IMPLANT
FACESHIELD LNG OPTICON STERILE (SAFETY) ×8 IMPLANT
GLOVE BIO SURGEON STRL SZ7 (GLOVE) ×2 IMPLANT
GLOVE BIO SURGEON STRL SZ7.5 (GLOVE) ×2 IMPLANT
GLOVE BIOGEL PI IND STRL 7.5 (GLOVE) IMPLANT
GLOVE BIOGEL PI IND STRL 8 (GLOVE) ×1 IMPLANT
GLOVE BIOGEL PI INDICATOR 7.5 (GLOVE)
GLOVE BIOGEL PI INDICATOR 8 (GLOVE) ×1
GLOVE ECLIPSE 7.0 STRL STRAW (GLOVE) ×2 IMPLANT
GOWN STRL REIN XL XLG (GOWN DISPOSABLE) ×4 IMPLANT
HANDPIECE INTERPULSE COAX TIP (DISPOSABLE) ×1
KIT BASIN OR (CUSTOM PROCEDURE TRAY) ×2 IMPLANT
PACK TOTAL JOINT (CUSTOM PROCEDURE TRAY) ×2 IMPLANT
PADDING CAST COTTON 6X4 STRL (CAST SUPPLIES) ×2 IMPLANT
RTRCTR WOUND ALEXIS 18CM MED (MISCELLANEOUS) ×2
SET HNDPC FAN SPRY TIP SCT (DISPOSABLE) ×1 IMPLANT
SUT ETHIBOND NAB CT1 #1 30IN (SUTURE) ×4 IMPLANT
SUT ETHILON 3 0 PS 1 (SUTURE) ×2 IMPLANT
SUT MNCRL AB 4-0 PS2 18 (SUTURE) ×2 IMPLANT
SUT VIC AB 1 CT1 36 (SUTURE) ×4 IMPLANT
SUT VIC AB 2-0 CT1 27 (SUTURE) ×2
SUT VIC AB 2-0 CT1 TAPERPNT 27 (SUTURE) ×2 IMPLANT
SUT VLOC 180 0 24IN GS25 (SUTURE) ×2 IMPLANT
TOWEL OR 17X26 10 PK STRL BLUE (TOWEL DISPOSABLE) ×4 IMPLANT
TOWEL OR NON WOVEN STRL DISP B (DISPOSABLE) ×2 IMPLANT
TRAY FOLEY CATH 14FRSI W/METER (CATHETERS) ×2 IMPLANT

## 2013-02-03 NOTE — Care Management Note (Addendum)
    Page 1 of 2   02/06/2013     9:54:37 AM   CARE MANAGEMENT NOTE 02/06/2013  Patient:  Vincent Herrera, Vincent Herrera   Account Number:  [id number]  Date Initiated:  02/03/2013  Documentation initiated by:  Lanier Clam  Subjective/Objective Assessment:   ADMITTED W/SEVERE OA R HIP.     Action/Plan:   FROM HOME.HAS PCP,PHARMACY.   Anticipated DC Date:  02/05/2013   Anticipated DC Plan:  HOME W HOME HEALTH SERVICES      DC Planning Services  CM consult      Choice offered to / List presented to:  C-1 Patient        HH arranged  HH-2 PT      [facility] agency  [facility]   Status of service:  Completed, signed off Medicare Important Message given?   (If response is "NO", the following Medicare IM given date fields will be blank) Date Medicare IM given:   Date Additional Medicare IM given:    Discharge Disposition:  HOME W HOME HEALTH SERVICES  Per UR Regulation:  Reviewed for med. necessity/level of care/duration of stay  If discussed at Long Length of Stay Meetings, dates discussed:    Comments:  02/04/13 1834 Leonie Green [phone] Cm spoke with patient with adult son present at bedside concerning discharge planning. Pt offered choice list on two separate  occassions for [facility] services. Pt informed [facility]ev[facility] Norlander no longer in pt's payor network. Per pt choice [facility] to provide Hh services upon discharge. Adult son to assist in home care. MD orders, face sheet, and H/P faxed to [facility] at [phone]. Confirmation received. No further needs stated. Pt has DME for home use.   02/03/13 KATHY MAHABIR RN,BSN NCM [phone] PROVIDED W/HHC AGENCY LIST.PATIENT USED [facility] IN PAST,& [facility] ALSO SHOWED UP.INSURANCE IS [employer].TC [facility] LEFT VM ON TEL#TO CONFIRM IF THEY TAKE INSURANCE. S/P R THA.HH PT ORDER PLACED.

## 2013-02-03 NOTE — Brief Op Note (Signed)
02/03/2013  11:26 AM  PATIENT:  Vincent Herrera  57 y.o. male  PRE-OPERATIVE DIAGNOSIS:  Severe osteoarthritis right hip  POST-OPERATIVE DIAGNOSIS:  Severe osteoarthritis right hip  PROCEDURE:  Procedure(s): Right TOTAL HIP ARTHROPLASTY ANTERIOR APPROACH (Right)  SURGEON:  Surgeon(s) and Role:    * Kathryne Hitch, MD - Primary  PHYSICIAN ASSISTANT:   Rexene Edison, PA-C  ANESTHESIA:   general  EBL:  Total I/O In: 1000 [I.V.:1000] Out: 500 [Urine:50; Blood:450]  BLOOD ADMINISTERED:none  DRAINS: none   LOCAL MEDICATIONS USED:  NONE  SPECIMEN:  No Specimen  DISPOSITION OF SPECIMEN:  N/A  COUNTS:  YES  TOURNIQUET:  * No tourniquets in log *  DICTATION: .Other Dictation: Dictation Number [id number]  PLAN OF CARE: Admit to inpatient   PATIENT DISPOSITION:  PACU - hemodynamically stable.   Delay start of Pharmacological VTE agent (>24hrs) due to surgical blood loss or risk of bleeding: no

## 2013-02-03 NOTE — H&P (Signed)
TOTAL HIP ADMISSION H&P  Patient is admitted for right total hip arthroplasty.  Subjective:  Chief Complaint: right hip pain  HPI: Vincent Herrera, 57 y.o. male, has a history of pain and functional disability in the right hip(s) due to arthritis and patient has failed non-surgical conservative treatments for greater than 12 weeks to include NSAID's and/or analgesics, use of assistive devices and activity modification.  Onset of symptoms was gradual starting 6 years ago with gradually worsening course since that time.The patient noted no past surgery on the right hip(s).  Patient currently rates pain in the right hip at 10 out of 10 with activity. Patient has night pain, worsening of pain with activity and weight bearing, trendelenberg gait, pain that interfers with activities of daily living, pain with passive range of motion and crepitus. Patient has evidence of subchondral cysts, subchondral sclerosis, periarticular osteophytes and joint space narrowing by imaging studies. This condition presents safety issues increasing the risk of falls.  There is no current active infection.  Patient Active Problem List   Diagnosis Date Noted  . Degenerative arthritis of hip 10/07/2012   Past Medical History  Diagnosis Date  . Arthritis   . 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 Anginal pain     anxiety related/ NEG ekg 10 yrs ago per patient, , no further cardiac testing  . GERD (gastroesophageal reflux disease)     ocas- otc med if needed  . Urinary frequency   . No blood products     pt is jehovah's witness    Past Surgical History  Procedure Laterality Date  . Nasal reconstruction      fractured nose  . Total hip arthroplasty  10/07/2012    Procedure: TOTAL HIP ARTHROPLASTY ANTERIOR APPROACH;  Surgeon: Kathryne Hitch, MD;  Location: [facility];  Service: Orthopedics;  Laterality: Left;  Left Total Hip Arthroplasty, Anterior Approach (C-Arm)     Prescriptions prior to admission  Medication Sig Dispense Refill  . Ascorbic Acid (VITAMIN C PO) Take by mouth. One daily      . HYDROcodone-acetaminophen (NORCO) 10-325 MG per tablet Take 1 tablet by mouth every 6 (six) hours as needed for pain.      . traMADol (ULTRAM) 50 MG tablet Take 50 mg by mouth every 6 (six) hours as needed for pain.      Marland Kitchen aspirin 325 MG tablet Take 325 mg by mouth every 6 (six) hours as needed for pain.      Marland Kitchen PRESCRIPTION MEDICATION Pt states he is taking a muscle relaxant pill every 6 hours - does not know name of medication       Allergies  Allergen Reactions  . Other     NO BLOOD OR BLOOD PRODUCTS-PT IS JEHOVAH'S WITNESS    History  Substance Use Topics  . Smoking status: Current Every Day Smoker -- 1.50 packs/day for 40 years    Types: Cigarettes  . Smokeless tobacco: Never Used  . Alcohol Use: Yes     Comment: 6 pack/week    History reviewed. No pertinent family history.   Review of Systems  Musculoskeletal: Positive for joint pain.  All other systems reviewed and are negative.    Objective:  Physical Exam  Constitutional: He is oriented to person, place, and time. He appears well-developed and well-nourished.  HENT:  Head: Normocephalic and atraumatic.  Eyes: EOM are normal. Pupils are equal, round, and reactive to light.  Neck:  Normal range of motion. Neck supple.  Cardiovascular: Normal rate and regular rhythm.   Respiratory: Effort normal and breath sounds normal.  GI: Soft. Bowel sounds are normal.  Musculoskeletal:       Right hip: He exhibits decreased range of motion, decreased strength, bony tenderness and crepitus.  Neurological: He is alert and oriented to person, place, and time.  Skin: Skin is warm and dry.  Psychiatric: He has a normal mood and affect.    Vital signs in last 24 hours: Temp:  [98.3 F (36.8 C)] 98.3 F (36.8 C) (03/21 0657) Pulse Rate:  [84] 84 (03/21 0657) Resp:  [20] 20 (03/21 0657) BP: (136)/(84)  136/84 mmHg (03/21 0657) SpO2:  [95 %] 95 % (03/21 0657)  Labs:   Estimated body mass index is 27.01 kg/(m^2) as calculated from the following:   Height as of 01/26/13: 5\' 9"  (1.753 m).   Weight as of 10/03/12: 83.008 kg (183 lb).   Imaging Review Plain radiographs demonstrate severe degenerative joint disease of the right hip(s). The bone quality appears to be good for age and reported activity level.  Assessment/Plan:  End stage arthritis, right hip(s)  The patient history, physical examination, clinical judgement of the provider and imaging studies are consistent with end stage degenerative joint disease of the right hip(s) and total hip arthroplasty is deemed medically necessary. The treatment options including medical management, injection therapy, arthroscopy and arthroplasty were discussed at length. The risks and benefits of total hip arthroplasty were presented and reviewed. The risks due to aseptic loosening, infection, stiffness, dislocation/subluxation,  thromboembolic complications and other imponderables were discussed.  The patient acknowledged the explanation, agreed to proceed with the plan and consent was signed. Patient is being admitted for inpatient treatment for surgery, pain control, PT, OT, prophylactic antibiotics, VTE prophylaxis, progressive ambulation and ADL's and discharge planning.The patient is planning to be discharged home with home health services

## 2013-02-03 NOTE — Transfer of Care (Signed)
Immediate Anesthesia Transfer of Care Note  Patient: Vincent Herrera  Procedure(s) Performed: Procedure(s) (LRB): Right TOTAL HIP ARTHROPLASTY ANTERIOR APPROACH (Right)  Patient Location: PACU  Anesthesia Type: General  Level of Consciousness: sedated, patient cooperative and responds to stimulaton  Airway & Oxygen Therapy: Patient Spontanous Breathing and Patient connected to face mask oxgen  Post-op Assessment: Report given to PACU RN and Post -op Vital signs reviewed and stable  Post vital signs: Reviewed and stable  Complications: No apparent anesthesia complications\

## 2013-02-03 NOTE — Anesthesia Postprocedure Evaluation (Signed)
Anesthesia Post Note  Patient: Vincent Herrera  Procedure(s) Performed: Procedure(s) (LRB): Right TOTAL HIP ARTHROPLASTY ANTERIOR APPROACH (Right)  Anesthesia type: general  Patient location: PACU  Post pain: Pain level controlled  Post assessment: Patient's Cardiovascular Status Stable  Last Vitals:  Filed Vitals:   02/03/13 1230  BP: 102/62  Pulse: 57  Temp:   Resp: 12    Post vital signs: Reviewed and stable  Level of consciousness: sedated  Complications: No apparent anesthesia complications

## 2013-02-03 NOTE — Anesthesia Preprocedure Evaluation (Signed)
Anesthesia Evaluation  Patient identified by MRN, date of birth, ID band Patient awake    Reviewed: Allergy & Precautions, H&P , NPO status , Patient's Chart, lab work & pertinent test results, reviewed documented beta blocker date and time   Airway Mallampati: II TM Distance: >3 FB Neck ROM: full    Dental   Pulmonary Current Smoker,  breath sounds clear to auscultation        Cardiovascular + angina negative cardio ROS  Rhythm:regular     Neuro/Psych negative neurological ROS  negative psych ROS   GI/Hepatic Neg liver ROS, GERD-  Medicated and Controlled,  Endo/Other  negative endocrine ROS  Renal/GU negative Renal ROS  negative genitourinary   Musculoskeletal   Abdominal   Peds  Hematology negative hematology ROS (+)   Anesthesia Other Findings See surgeon's H&P   Reproductive/Obstetrics negative OB ROS                           Anesthesia Physical Anesthesia Plan  ASA: II  Anesthesia Plan: General   Post-op Pain Management:    Induction: Intravenous  Airway Management Planned: Oral ETT  Additional Equipment:   Intra-op Plan:   Post-operative Plan: Extubation in OR  Informed Consent: I have reviewed the patients History and Physical, chart, labs and discussed the procedure including the risks, benefits and alternatives for the proposed anesthesia with the patient or authorized representative who has indicated his/her understanding and acceptance.   Dental Advisory Given  Plan Discussed with: CRNA and Surgeon  Anesthesia Plan Comments:         Anesthesia Quick Evaluation

## 2013-02-04 LAB — BASIC METABOLIC PANEL
Chloride: 96 mEq/L (ref 96–112)
GFR calc Af Amer: >90 mL/min (ref >90)
GFR calc non Af Amer: 81 mL/min — ABNORMAL LOW (ref >90)
Potassium: 3.6 mEq/L (ref 3.5–5.1)
Sodium: 132 mEq/L — ABNORMAL LOW (ref 135–145)

## 2013-02-04 LAB — CBC
HCT: 33.6 % — ABNORMAL LOW (ref 39.0–52.0)
MCHC: 33 g/dL (ref 30.0–36.0)
Platelets: 233 10*3/uL (ref 150–400)
RDW: 13.4 % (ref 11.5–15.5)
WBC: 7.3 10*3/uL (ref 4.0–10.5)

## 2013-02-04 NOTE — Evaluation (Signed)
Occupational Therapy Evaluation Patient Details Name: Vincent Herrera MRN: [id number] DOB: Oct 14, 1956 Today's Date: 02/04/2013 Time: 1478-2956    OT Assessment / Plan / Recommendation Clinical Impression  Pt presents to OT s/p R THA (anterior). Pt with decreased I with ADL activity and will benefit from skilled OT to increase I with ADL activity     OT Assessment  Patient needs continued OT Services    Follow Up Recommendations  Home health OT       Equipment Recommendations  None recommended by OT       Frequency  Min 3X/week    Precautions / Restrictions Precautions Precautions: Fall Restrictions Weight Bearing Restrictions: No Other Position/Activity Restrictions: WBAT       ADL  Lower Body Dressing: Simulated;+2 Total assistance Lower Body Dressing: Patient Percentage: 60% Where Assessed - Lower Body Dressing: Supported sit to stand Toilet Transfer: Simulated;Other (comment);Performed;+2 Total assistance (bed to chair) Toilet Transfer: Patient Percentage: 60% Toilet Transfer Method: Sit to stand Toileting - Clothing Manipulation and Hygiene: Simulated;Moderate assistance Where Assessed - Toileting Clothing Manipulation and Hygiene: Standing Transfers/Ambulation Related to ADLs: Pt in significant pain which is limiting    OT Diagnosis: Generalized weakness;Acute pain  OT Problem List: Pain;Decreased strength OT Treatment Interventions: Self-care/ADL training;Patient/family education;DME and/or AE instruction   OT Goals Acute Rehab OT Goals OT Goal Formulation: With patient Time For Goal Achievement: 02/18/13 Potential to Achieve Goals: Good ADL Goals Pt Will Perform Grooming: with supervision;Standing at sink ADL Goal: Grooming - Progress: Goal set today Pt Will Perform Lower Body Dressing: with modified independence;Sit to stand from chair ADL Goal: Lower Body Dressing - Progress: Goal set today Pt Will Transfer to Toilet: with modified independence ADL Goal:  Toilet Transfer - Progress: Goal set today Pt Will Perform Toileting - Clothing Manipulation: with modified independence;Standing ADL Goal: Toileting - Clothing Manipulation - Progress: Goal set today  Visit Information  Last OT Received On: 02/04/13 Assistance Needed: +2    Subjective Data  Subjective: i am hurting!   Prior Functioning     Home Living Lives With: Alone Available Help at Discharge: Family Type of Home: House Home Access: Stairs to enter Secretary/administrator of Steps: 3 Entrance Stairs-Rails: Right Home Layout: One level Bathroom Shower/Tub: Tub/shower unit;Curtain Firefighter: Standard Home Adaptive Equipment: Environmental consultant - rolling;Crutches Prior Function Level of Independence: Independent Driving: Yes Comments: runs a ban saw Communication Communication: No difficulties         Vision/Perception Vision - History Baseline Vision: No visual deficits Vision - Assessment Eye Alignment: Within Functional Limits   Cognition  Cognition Overall Cognitive Status: Appears within functional limits for tasks assessed/performed Arousal/Alertness: Awake/alert Orientation Level: Appears intact for tasks assessed Behavior During Session: [facility] for tasks performed    Extremity/Trunk Assessment Right Upper Extremity Assessment RUE ROM/Strength/Tone: Within functional levels Left Upper Extremity Assessment LUE ROM/Strength/Tone: Within functional levels Right Lower Extremity Assessment RLE ROM/Strength/Tone: Deficits;Unable to fully assess;Due to pain RLE Sensation: WFL - Light Touch Left Lower Extremity Assessment LLE ROM/Strength/Tone: WFL for tasks assessed LLE Sensation: WFL - Light Touch Trunk Assessment Trunk Assessment: Normal     Mobility Bed Mobility Bed Mobility: Supine to Sit Supine to Sit: 1: +2 Total assist Supine to Sit: Patient Percentage: 30% Transfers Transfers: Sit to Stand;Stand to Sit Sit to Stand: 1: +2 Total assist;With upper  extremity assist;From bed;With armrests Sit to Stand: Patient Percentage: 50% Stand to Sit: 1: +2 Total assist;With upper extremity assist;To chair/3-in-1;With armrests Stand to Sit: Patient Percentage:  60% Details for Transfer Assistance: Assist to rise, steady and ensure controlled descent with cues for hand placement, safety and LE management.  Pt only able to take a few steps from bed to chair with +2 assist for safety and cues for sequencing/technique with RW.            End of Session OT - End of Session Activity Tolerance: Patient limited by pain Patient left: in chair;with call bell/phone within reach  GO     [facility] [place]ung 02/04/2013, 9:01 AM

## 2013-02-04 NOTE — Op Note (Signed)
[name], [name] NO.:  [id number]  MEDICAL RECORD NO.:  [id number]  LOCATION:  1540                         FACILITY:  [facility]  PHYSICIAN:  Vanita Panda. 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  PROCEDURE:  Right total hip arthroplasty through direct anterior approach.  IMPLANTS:  DePuy Sector Gription acetabular component size 54, size 36 +4 neutral polyethylene liner, size 11 Corail femoral component with standard offset, size 36 -2 metal hip ball.  SURGEON:  Vanita Panda. Magnus Ivan, M.D.  ASSISTANT:  Richardean Canal, P.A.  ANESTHESIA:  General.  BLOOD LOSS:  450 mL.  COMPLICATIONS:  None.  INDICATION:  Vincent Herrera is a 57 year old gentleman well known to me. He has had severe bilateral end-stage arthritis of his hips.  Several months ago, he underwent successful left direct anterior hip replacement surgery.  Due to the debilitating pain that he has experienced of his right hip, he wished to proceed with hip replacement on the right side. Now, he understands the risks and benefits of surgery and does wish to proceed.  His x-ray shows severe end-stage arthritis with narrowing of the joint space, subchondral sclerosis, and osteophytes as well as cystic changes.  His pain is daily, his mobility is severely limited, and he still has to ambulate with a walker due to the pain in his hip and this is now keeping him from holding his job.  PROCEDURE DESCRIPTION:  After informed consent was obtained and appropriate right hip was marked, he was brought to the operating room, general anesthesia was obtained.  A Foley catheter was placed and both feet were placed in traction  boots.  He was next placed supine on the Hana fracture table with both legs in inline skeletal traction, no traction applied.  A perineal post was placed as well.  Under direct fluoroscopy, we were able to assess the hip on the right side as well as left side in the center of the pelvis for measuring leg lengths.  We then prepped his right hip with DuraPrep and sterile drapes.  Time-out was called and he was identified as correct patient and correct right hip.  We then made an incision just inferior and posterior to the anterior-superior iliac spine and carried this obliquely down the leg. We were able to dissect down to the tensor fascia lata muscle and the tensor fascia was then divided longitudinally.  I proceeded with a direct anterior approach to the hip.  At that point, a Cobra retractor was placed around the lateral neck and then up underneath the rectus femoris, a medial retractor was placed.  I cauterized the lateral femoral circumflex vessels.  I then divided the hip capsule in a L-type format and found a large effusion.  I placed the Cobra retractors within the hip capsule.  Next, I made my femoral neck cut with an oscillating saw just proximal to the lesser trochanter and completed this with an  osteotome.  I removed the femoral head in its entirety and found it to be devoid of cartilage.  We cleaned the osteophytes from around the acetabulum as well as remnants of the labrum.  I placed a bent Hohmann medially and a Cobra retractor laterally.  We then began reaming from a size 42 reamer and 2-mm increments all the way to 54 with the last 2 reamers were placed under direct fluoroscopy.  All reamers were placed under direct visualization.  Once obtain my depth of reaming, my inclination and anteversion, I placed a real DePuy Sector Gription acetabular component size 54 with a neutral +4 36 polyethylene liner. Attention was then turned to the femur.  With the leg externally  rotated to 90 degrees, extended and adducted, we begin access to the femoral canal.  We placed a Mueller retractor medially and a bent Hohmann behind the greater trochanter.  I released the lateral capsule and then delivered the femur little higher.  We used a rongeur to lateralize and a box cutting guide to open up the femoral canal.  We then began broaching from a size 8 broach up to the size 11.  I was pleased with the size 11.  We used a calcar reamer off of this and then I trialed a standard neck and a 36+ 1.5 hip ball.  We brought the leg back over and up with traction and internal rotation, reduced the hip.  His leg lengths showed he was just slightly long, but he was stable.  From that point, we decided to place the real components with the leg again extended and adducted.  We removed the trial components.  I placed the real size 11 femoral component with standard offset followed by the real 36 -2 metal hip ball, reduced this into the pelvis and it was stable. We copiously irrigated the soft tissue with normal saline solution.  I closed the joint capsule with interrupted #1 Ethibond suture followed by running #1 Vicryl in the tensor fascia, 0 Vicryl deep, 2-0 Vicryl subcutaneous, and 4-0 Monocryl subcuticular stitch with Dermabond and the sterile dressing applied.  He was taken off of the Hana table, awakened, extubated, and taken to the recovery room in stable condition. All final counts were correct.  There were no complications noted.     Vanita Panda. Magnus Ivan, M.D.     CYB/MEDQ  D:  02/03/2013  T:  02/04/2013  Job:  [id number]

## 2013-02-04 NOTE — Care Management (Addendum)
Cm spoke with patient with adult son present at bedside concerning discharge planning. Pt offered choice list on two separate  occassions for [facility] services. Pt informed [facility] no longer in pt's payor network. Per pt choice [facility] to provide Hh services upon discharge. Adult son to assist in home care. MD orders, face sheet, and H/P faxed to [facility] at [phone]. Confirmation received. No further needs stated. Pt has DME for home use.   Roxy Manns Joesph Marcy,RN,BSN [phone]

## 2013-02-04 NOTE — Progress Notes (Signed)
PT TEMP 101.4 AT 1415.  ENCOURAGED PT TO USE INCENTIVE SPIROMETRY, PT THEN COUGHED UP YELLOW PHLEM.  TEMP DECREASED TO 100.4.  WILL KEEP ENCOURAGING PT TO USE IS.

## 2013-02-04 NOTE — Evaluation (Signed)
Physical Therapy Evaluation Patient Details Name: Vincent Herrera MRN: [id number] DOB: 1956/02/07 Today's Date: 02/04/2013 Time: 4098-1191 PT Time Calculation (min): 22 min  PT Assessment / Plan / Recommendation Clinical Impression  Pt presents s/p R THA (direct ant) POD 1 with decreased strength, ROM and mobility.  Tolerated OOB to chair with +2 assist for safety, however pt unable to ambulate due to increased pain.  RN provided meds while PT in room.  Pt will benefit from skilled PT in acute venue to address deficits.  PT recommends HHPT for follow up at D/c to maximize pts safety and function.     PT Assessment  Patient needs continued PT services    Follow Up Recommendations  Home health PT;Supervision for mobility/OOB    Does the patient have the potential to tolerate intense rehabilitation      Barriers to Discharge None      Equipment Recommendations  None recommended by PT    Recommendations for Other Services     Frequency 7X/week    Precautions / Restrictions Precautions Precautions: Fall Restrictions Weight Bearing Restrictions: No Other Position/Activity Restrictions: WBAT   Pertinent Vitals/Pain 6/10, pain meds given       Mobility  Bed Mobility Bed Mobility: Supine to Sit Supine to Sit: 1: +2 Total assist Supine to Sit: Patient Percentage: 30% Transfers Transfers: Stand Pivot Transfers Sit to Stand: 1: +2 Total assist;With upper extremity assist;From bed;With armrests Sit to Stand: Patient Percentage: 50% Stand to Sit: 1: +2 Total assist;With upper extremity assist;To chair/3-in-1;With armrests Stand to Sit: Patient Percentage: 60% Stand Pivot Transfers: 1: +2 Total assist Stand Pivot Transfers: Patient Percentage: 60% Details for Transfer Assistance: Assist to rise, steady and ensure controlled descent with cues for hand placement, safety and LE management.  Pt only able to take a few steps from bed to chair with +2 assist for safety and cues for  sequencing/technique with RW.  Ambulation/Gait Ambulation/Gait Assistance: Not tested (comment) Assistive device: Rolling walker Stairs: No Wheelchair Mobility Wheelchair Mobility: No    Exercises     PT Diagnosis: Difficulty walking;Generalized weakness;Acute pain  PT Problem List: Decreased strength;Decreased range of motion;Decreased activity tolerance;Decreased balance;Decreased mobility;Decreased knowledge of use of DME;Decreased knowledge of precautions;Decreased coordination;Pain PT Treatment Interventions: DME instruction;Gait training;Stair training;Functional mobility training;Therapeutic activities;Therapeutic exercise;Balance training;Patient/family education   PT Goals Acute Rehab PT Goals PT Goal Formulation: With patient Time For Goal Achievement: 02/08/13 Potential to Achieve Goals: Good Pt will go Supine/Side to Sit: with supervision PT Goal: Supine/Side to Sit - Progress: Goal set today Pt will go Sit to Supine/Side: with supervision PT Goal: Sit to Supine/Side - Progress: Goal set today Pt will go Sit to Stand: with supervision PT Goal: Sit to Stand - Progress: Goal set today Pt will go Stand to Sit: with supervision PT Goal: Stand to Sit - Progress: Goal set today Pt will Ambulate: 51 - 150 feet;with supervision;with least restrictive assistive device PT Goal: Ambulate - Progress: Goal set today Pt will Go Up / Down Stairs: 3-5 stairs;with least restrictive assistive device;with rail(s);with min assist PT Goal: Up/Down Stairs - Progress: Goal set today  Visit Information  Last PT Received On: 02/04/13 Assistance Needed: +2    Subjective Data  Subjective: I had a horrible night.  Patient Stated Goal: to return home.    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 Communication Communication: No difficulties    Cognition  Cognition Overall Cognitive Status: Appears within functional limits for tasks assessed/performed Arousal/Alertness: Awake/alert Orientation Level: Appears intact for tasks assessed Behavior During Session: [facility] for tasks performed    Extremity/Trunk Assessment Right Upper Extremity Assessment RUE ROM/Strength/Tone: Within functional levels Left Upper Extremity Assessment LUE ROM/Strength/Tone: Within functional levels Right Lower Extremity Assessment RLE ROM/Strength/Tone: Deficits;Unable to fully assess;Due to pain RLE Sensation: WFL - Light Touch Left Lower Extremity Assessment LLE ROM/Strength/Tone: WFL for tasks assessed LLE Sensation: WFL - Light Touch Trunk Assessment Trunk Assessment: Normal   Balance    End of Session PT - End of Session Equipment Utilized During Treatment: Gait belt Activity Tolerance: Patient limited by pain Patient left: in chair;with call bell/phone within reach Nurse Communication: Mobility status  GP     Vista Deck 02/04/2013, 8:55 AM

## 2013-02-04 NOTE — Progress Notes (Signed)
Physical Therapy Treatment Patient Details Name: Vincent Herrera MRN: [id number] DOB: June 25, 1956 Today's Date: 02/04/2013 Time: 5784-6962 PT Time Calculation (min): 28 min  PT Assessment / Plan / Recommendation Comments on Treatment Session  Pt making very slow progress due to increased pain and now with bloating/cramping in stomach.  Encouraged pt to attempt to ambulate in hopes of relieving some stomach pain, however pt unable this afternoon.     Follow Up Recommendations  Home health PT;Supervision for mobility/OOB     Does the patient have the potential to tolerate intense rehabilitation     Barriers to Discharge        Equipment Recommendations  None recommended by PT    Recommendations for Other Services    Frequency 7X/week   Plan Discharge plan remains appropriate    Precautions / Restrictions Precautions Precautions: Fall Restrictions Weight Bearing Restrictions: No Other Position/Activity Restrictions: WBAT   Pertinent Vitals/Pain 7/10 pain, RN notified.     Mobility  Bed Mobility Bed Mobility: Supine to Sit;Sit to Supine Supine to Sit: 1: +2 Total assist Supine to Sit: Patient Percentage: 40% Sit to Supine: 1: +2 Total assist Sit to Supine: Patient Percentage: 20% Details for Bed Mobility Assistance: Continues to require assist for LEs out of bed and for trunk to attain sitting position.  Max cues for hand placement and technique to self assist.  Transfers Transfers: Sit to Stand;Stand to Sit Sit to Stand: 1: +2 Total assist;With upper extremity assist;From bed;With armrests Sit to Stand: Patient Percentage: 50% Stand to Sit: 1: +2 Total assist;With upper extremity assist;To chair/3-in-1;With armrests Stand to Sit: Patient Percentage: 60% Details for Transfer Assistance: Assist to rise, steady and ensure controlled descent with max cues for hand placement and safety as pt would attempt to sit back to bed without ensuring hand placement.   Ambulation/Gait Ambulation/Gait Assistance: 1: +2 Total assist Ambulation/Gait: Patient Percentage: 50% Ambulation Distance (Feet): 3 Feet Assistive device: Rolling walker Ambulation/Gait Assistance Details: Pt only able to take several side steps towards [facility] for better positioning once in bed.  Max cues for sequencing/technique with RW.  Gait Pattern: Step-to pattern;Antalgic;Trunk flexed Gait velocity: decreased    Exercises Total Joint Exercises Ankle Circles/Pumps: AROM;Both;20 reps Quad Sets: AROM;Right;10 reps Heel Slides: AAROM;Right;10 reps Hip ABduction/ADduction: AAROM;Right;10 reps   PT Diagnosis:    PT Problem List:   PT Treatment Interventions:     PT Goals Acute Rehab PT Goals PT Goal Formulation: With patient Time For Goal Achievement: 02/08/13 Potential to Achieve Goals: Good Pt will go Supine/Side to Sit: with supervision PT Goal: Supine/Side to Sit - Progress: Progressing toward goal Pt will go Sit to Supine/Side: with supervision PT Goal: Sit to Supine/Side - Progress: Progressing toward goal Pt will go Sit to Stand: with supervision PT Goal: Sit to Stand - Progress: Progressing toward goal Pt will go Stand to Sit: with supervision PT Goal: Stand to Sit - Progress: Progressing toward goal  Visit Information  Last PT Received On: 02/04/13 Assistance Needed: +2    Subjective Data  Subjective: My stomach and my hip hurts.  Patient Stated Goal: to return home.    Cognition  Cognition Overall Cognitive Status: Appears within functional limits for tasks assessed/performed Arousal/Alertness: Lethargic Orientation Level: Appears intact for tasks assessed Behavior During Session: Lethargic    Balance     End of Session PT - End of Session Activity Tolerance: Patient limited by pain Patient left: in bed;with call bell/phone within reach;with family/visitor present Nurse Communication: Mobility  status (need for something to relieve cramping in stomach)    GP     Ziona Wickens, Meribeth Mattes 02/04/2013, 4:39 PM

## 2013-02-04 NOTE — Progress Notes (Signed)
Patient ID: Sonya Pucci, male   DOB: 11-Jun-1956, 57 y.o.   MRN: [id number] Subjective: 1 Day Post-Op Procedure(s) (LRB): Right TOTAL HIP ARTHROPLASTY ANTERIOR APPROACH (Right) Awake, alert and Ox4. Somewhat somulent. Mod pain. Patient reports pain as moderate.    Objective:   VITALS:  Temp:  [97.7 F (36.5 C)-100.4 F (38 C)] 100.2 F (37.9 C) (03/22 0519) Pulse Rate:  [72-98] 86 (03/22 0519) Resp:  [16-20] 16 (03/22 0519) BP: (104-128)/(61-80) 127/61 mmHg (03/22 0519) SpO2:  [94 %-100 %] 100 % (03/22 0519)  Neurologically intact ABD soft Intact pulses distally Dorsiflexion/Plantar flexion intact Incision: dressing C/D/I and scant drainage   LABS  Recent Labs  02/04/13 0525  HGB 11.1*  WBC 7.3  PLT 233    Recent Labs  02/04/13 0525  NA 132*  K 3.6  CL 96  CO2 29  BUN 10  CREATININE 1.00  GLUCOSE 116*   No results found for this basename: LABPT, INR,  in the last 72 hours   Assessment/Plan: 1 Day Post-Op Procedure(s) (LRB): Right TOTAL HIP ARTHROPLASTY ANTERIOR APPROACH (Right)  Advance diet Up with therapy Discharge home with home health  NITKA,Ausencio E 02/04/2013, 1:55 PM

## 2013-02-05 LAB — BASIC METABOLIC PANEL
BUN: 9 mg/dL (ref 6–23)
Chloride: 98 mEq/L (ref 96–112)
Glucose, Bld: 141 mg/dL — ABNORMAL HIGH (ref 70–99)
Potassium: 3.4 mEq/L — ABNORMAL LOW (ref 3.5–5.1)
Sodium: 133 mEq/L — ABNORMAL LOW (ref 135–145)

## 2013-02-05 LAB — CBC
HCT: 34.5 % — ABNORMAL LOW (ref 39.0–52.0)
Hemoglobin: 11.5 g/dL — ABNORMAL LOW (ref 13.0–17.0)
RBC: 4.03 MIL/uL — ABNORMAL LOW (ref 4.22–5.81)
WBC: 12.4 10*3/uL — ABNORMAL HIGH (ref 4.0–10.5)

## 2013-02-05 MED ORDER — SENNA 8.6 MG PO TABS: 1.0000 | ORAL_TABLET | Freq: Every day | ORAL | Status: DC | PRN

## 2013-02-05 MED ORDER — POLYETHYLENE GLYCOL 3350 17 G PO PACK
17.0000 g | PACK | Freq: Every day | ORAL | Status: DC
  Administered 2013-02-05 – 2013-02-06 (×2): 17 g via ORAL
  Filled 2013-02-05 (×2): qty 1

## 2013-02-05 MED ORDER — FERROUS SULFATE 325 (65 FE) MG PO TABS
325.0000 mg | ORAL_TABLET | Freq: Every day | ORAL | Status: DC
  Administered 2013-02-06: 325 mg via ORAL
  Filled 2013-02-05 (×2): qty 1

## 2013-02-05 NOTE — Progress Notes (Signed)
Patient ID: Vincent Herrera, male   DOB: 09-Oct-1956, 57 y.o.   MRN: [id number] Subjective: 2 Days Post-Op Procedure(s) (LRB): Right TOTAL HIP ARTHROPLASTY ANTERIOR APPROACH (Right) Awake alert and Ox4.  Nausea and vomitting last night. Patient reports pain as moderate.    Objective:   VITALS:  Temp:  [98 F (36.7 C)-101.4 F (38.6 C)] 98 F (36.7 C) (03/23 0612) Pulse Rate:  [86-98] 86 (03/23 0612) Resp:  [16-20] 16 (03/23 0612) BP: (105-116)/(67-75) 105/67 mmHg (03/23 0612) SpO2:  [93 %-99 %] 97 % (03/23 0612)  Neurologically intact ABD soft Sensation intact distally Intact pulses distally Dorsiflexion/Plantar flexion intact Incision: scant drainage No cellulitis present   LABS  Recent Labs  02/04/13 0525 02/05/13 0414  HGB 11.1* 11.5*  WBC 7.3 12.4*  PLT 233 223    Recent Labs  02/04/13 0525 02/05/13 0414  NA 132* 133*  K 3.6 3.4*  CL 96 98  CO2 29 29  BUN 10 9  CREATININE 1.00 0.79  GLUCOSE 116* 141*   No results found for this basename: LABPT, INR,  in the last 72 hours   Assessment/Plan: 2 Days Post-Op Procedure(s) (LRB): Right TOTAL HIP ARTHROPLASTY ANTERIOR APPROACH (Right)  Advance diet Up with therapy D/C IV fluids Discharge home with home health Needs bowel function to improve.  Gracianna Vink,Vern E 02/05/2013, 8:58 AM

## 2013-02-05 NOTE — Progress Notes (Signed)
Physical Therapy Treatment Patient Details Name: Vincent Herrera MRN: [id number] DOB: 1956/11/04 Today's Date: 02/05/2013 Time: 4098-1191 PT Time Calculation (min): 21 min  PT Assessment / Plan / Recommendation Comments on Treatment Session  pt continues to progress with amb, but still having trouble with bed mobility.  Also continues to have pain in abdomen and has not been able to have a bowel movement yet.  Will practice stairs tomorrow in anticipation for D/C.     Follow Up Recommendations  Home health PT;Supervision for mobility/OOB     Does the patient have the potential to tolerate intense rehabilitation     Barriers to Discharge        Equipment Recommendations  None recommended by PT    Recommendations for Other Services    Frequency 7X/week   Plan Discharge plan remains appropriate    Precautions / Restrictions Precautions Precautions: Fall Restrictions Weight Bearing Restrictions: No Other Position/Activity Restrictions: WBAT   Pertinent Vitals/Pain Continues to have increased pain in abdomen and hip, ice pack applied to hip.     Mobility  Bed Mobility Bed Mobility: Supine to Sit;Sit to Supine Supine to Sit: 3: Mod assist;HOB flat Sit to Supine: 3: Mod assist;HOB flat Details for Bed Mobility Assistance: Assist for RLE into and out of bed with continued assist needed for trunk to attain sitting position due to pain in abdomen.  Also requires constant cues for breathing as pt tends to hold his breath.  Transfers Transfers: Sit to Stand;Stand to Sit;Stand Pivot Transfers Sit to Stand: 4: Min assist;From elevated surface;With upper extremity assist;From bed Stand to Sit: 4: Min assist;With upper extremity assist;To bed Details for Transfer Assistance: cues for hand placement and safety.  Transfers are improving.  Ambulation/Gait Ambulation/Gait Assistance: 4: Min assist Ambulation Distance (Feet): 90 Feet Assistive device: Rolling walker Ambulation/Gait  Assistance Details: Pt continues to improve with amb.  Min cues for not letting RW get too far ahead of him and for upright posture.  Gait Pattern: Step-to pattern;Antalgic;Trunk flexed Gait velocity: decreased    Exercises     PT Diagnosis:    PT Problem List:   PT Treatment Interventions:     PT Goals Acute Rehab PT Goals PT Goal Formulation: With patient Time For Goal Achievement: 02/08/13 Potential to Achieve Goals: Good Pt will go Supine/Side to Sit: with supervision PT Goal: Supine/Side to Sit - Progress: Progressing toward goal Pt will go Sit to Supine/Side: with supervision PT Goal: Sit to Supine/Side - Progress: Progressing toward goal Pt will go Sit to Stand: with supervision PT Goal: Sit to Stand - Progress: Progressing toward goal Pt will go Stand to Sit: with supervision PT Goal: Stand to Sit - Progress: Progressing toward goal Pt will Ambulate: 51 - 150 feet;with supervision;with least restrictive assistive device PT Goal: Ambulate - Progress: Progressing toward goal  Visit Information  Last PT Received On: 02/05/13 Assistance Needed: +1    Subjective Data  Subjective: I still haven't been able to go to the bathroom.  Patient Stated Goal: to return home.    Cognition  Cognition Overall Cognitive Status: Appears within functional limits for tasks assessed/performed Arousal/Alertness: Lethargic Orientation Level: Appears intact for tasks assessed Behavior During Session: Lethargic    Balance     End of Session PT - End of Session Activity Tolerance: Patient limited by pain Patient left: in bed;with call bell/phone within reach;with family/visitor present Nurse Communication: Mobility status   GP     Vista Deck 02/05/2013, 5:54 PM

## 2013-02-05 NOTE — Progress Notes (Signed)
Pt c/o nausea last night and had to be medicated several times.  After he passed gas and had a small bm he stated that he felt better.  He c/o constipation yesterday and requested an enema.

## 2013-02-05 NOTE — Progress Notes (Signed)
Physical Therapy Treatment Patient Details Name: Vincent Herrera MRN: [id number] DOB: 04-05-1956 Today's Date: 02/05/2013 Time: 8657-8469 PT Time Calculation (min): 25 min  PT Assessment / Plan / Recommendation Comments on Treatment Session  Pt able to ambulate in hallway today, however continues to have increased pain in hip and stomach (more so in stomach).  Attempted to have bowel movement, however unsuccessful and unable to pass any gas.  He did belch many times without relief then became nauseated upon returning to room.  RN notified.     Follow Up Recommendations  Home health PT;Supervision for mobility/OOB     Does the patient have the potential to tolerate intense rehabilitation     Barriers to Discharge        Equipment Recommendations  None recommended by PT    Recommendations for Other Services    Frequency 7X/week   Plan Discharge plan remains appropriate    Precautions / Restrictions Precautions Precautions: Fall Restrictions Weight Bearing Restrictions: No Other Position/Activity Restrictions: WBAT   Pertinent Vitals/Pain Max c/o pain in abdomen and hip (more so in abdomen)    Mobility  Bed Mobility Bed Mobility: Supine to Sit Supine to Sit: 3: Mod assist;2: Max assist;HOB elevated Details for Bed Mobility Assistance: Assist for RLE and trunk to attain sitting position.  Max cues for hand placement on bed instead of rails to better simulate home environment.  Transfers Transfers: Sit to Stand;Stand to [employer] Transfers Sit to Stand: 4: Min assist;3: Mod assist;From elevated surface;From bed;With upper extremity assist Stand to Sit: 4: Min assist;With upper extremity assist;With armrests;To chair/3-in-1 Stand Pivot Transfers: 4: Min assist Details for Transfer Assistance: Continues to require assist to rise and steady, however standing/sitting much improved today from yesterday.  Cues for hand placement, safety and LE management.  (Performed x 2 in order  to use 3in1) Ambulation/Gait Ambulation/Gait Assistance: 4: Min assist Ambulation Distance (Feet): 50 Feet Assistive device: Rolling walker Ambulation/Gait Assistance Details: Cues for sequencing/technique with RW and to maintain upright posture.   Gait Pattern: Step-to pattern;Antalgic;Trunk flexed Gait velocity: decreased    Exercises     PT Diagnosis:    PT Problem List:   PT Treatment Interventions:     PT Goals Acute Rehab PT Goals PT Goal Formulation: With patient Time For Goal Achievement: 02/08/13 Potential to Achieve Goals: Good Pt will go Supine/Side to Sit: with supervision PT Goal: Supine/Side to Sit - Progress: Progressing toward goal Pt will go Sit to Stand: with supervision PT Goal: Sit to Stand - Progress: Progressing toward goal Pt will go Stand to Sit: with supervision PT Goal: Stand to Sit - Progress: Progressing toward goal Pt will Ambulate: 51 - 150 feet;with supervision;with least restrictive assistive device PT Goal: Ambulate - Progress: Progressing toward goal  Visit Information  Last PT Received On: 02/05/13 Assistance Needed: +1    Subjective Data  Subjective: My stomach hurts more than my hip, but I feel like I can use the restroom.  Patient Stated Goal: to return home.    Cognition  Cognition Overall Cognitive Status: Appears within functional limits for tasks assessed/performed Arousal/Alertness: Lethargic Orientation Level: Appears intact for tasks assessed Behavior During Session: Lethargic    Balance     End of Session PT - End of Session Equipment Utilized During Treatment: Gait belt Activity Tolerance: Patient limited by pain Patient left: in chair;with call bell/phone within reach Nurse Communication: Mobility status   GP     Vista Deck 02/05/2013, 10:42 AM

## 2013-02-06 ENCOUNTER — Encounter (HOSPITAL_COMMUNITY): Payer: Self-pay | Admitting: Orthopaedic Surgery

## 2013-02-06 LAB — CBC
Hemoglobin: 10.3 g/dL — ABNORMAL LOW (ref 13.0–17.0)
MCV: 85 fL (ref 78.0–100.0)
Platelets: 209 10*3/uL (ref 150–400)
RBC: 3.53 MIL/uL — ABNORMAL LOW (ref 4.22–5.81)
WBC: 10.8 10*3/uL — ABNORMAL HIGH (ref 4.0–10.5)

## 2013-02-06 MED ORDER — ASPIRIN 325 MG PO TBEC: 325.0000 mg | DELAYED_RELEASE_TABLET | Freq: Two times a day (BID) | ORAL | Status: DC

## 2013-02-06 MED ORDER — METHOCARBAMOL 500 MG PO TABS: 500.0000 mg | ORAL_TABLET | Freq: Four times a day (QID) | ORAL | Status: DC | PRN

## 2013-02-06 MED ORDER — OXYCODONE-ACETAMINOPHEN 5-325 MG PO TABS: 1.0000 | ORAL_TABLET | ORAL | Status: DC | PRN

## 2013-02-06 NOTE — Progress Notes (Signed)
Subjective: 3 Days Post-Op Procedure(s) (LRB): Right TOTAL HIP ARTHROPLASTY ANTERIOR APPROACH (Right) Patient reports pain as moderate.  Feels better today.  Less nausea.  Asymptomatic acute blood loss anemia.  Objective: Vital signs in last 24 hours: Temp:  [98.2 F (36.8 C)-98.5 F (36.9 C)] 98.5 F (36.9 C) (03/24 0455) Pulse Rate:  [78-87] 78 (03/24 0455) Resp:  [16-20] 20 (03/24 0455) BP: (116-133)/(78-84) 133/84 mmHg (03/24 0455) SpO2:  [90 %-99 %] 92 % (03/24 0455)  Intake/Output from previous day: 03/23 0701 - 03/24 0700 In: 2617.5 [P.O.:480; I.V.:2137.5] Out: 1325 [Urine:1325] Intake/Output this shift:     Recent Labs  02/04/13 0525 02/05/13 0414 02/06/13 0427  HGB 11.1* 11.5* 10.3*    Recent Labs  02/05/13 0414 02/06/13 0427  WBC 12.4* 10.8*  RBC 4.03* 3.53*  HCT 34.5* 30.0*  PLT 223 209    Recent Labs  02/04/13 0525 02/05/13 0414  NA 132* 133*  K 3.6 3.4*  CL 96 98  CO2 29 29  BUN 10 9  CREATININE 1.00 0.79  GLUCOSE 116* 141*  CALCIUM 8.4 8.8   No results found for this basename: LABPT, INR,  in the last 72 hours  Sensation intact distally Intact pulses distally Dorsiflexion/Plantar flexion intact Incision: scant drainage No cellulitis present  Assessment/Plan: 3 Days Post-Op Procedure(s) (LRB): Right TOTAL HIP ARTHROPLASTY ANTERIOR APPROACH (Right) Discharge home with home health  Kathryne Hitch 02/06/2013, 7:11 AM

## 2013-02-06 NOTE — Progress Notes (Signed)
Pt to d/c [facility] with [facility]. AVS reviewed and "My Chart" discussed with pt. Pt capable of verbalizing medications and follow-up appointments. Remains hemodynamically stable. No signs and symptoms of distress. Educated pt to return to ER in the case of SOB, dizziness, or chest pain.

## 2013-02-06 NOTE — Progress Notes (Signed)
Occupational Therapy Treatment Patient Details Name: Vincent Herrera MRN: [id number] DOB: [birth year] Today's Date: 02/06/2013 Time: 1050-1110 OT Time Calculation (min): 20 min  OT Assessment / Plan / Recommendation Comments on Treatment Session doing better this OT session    Follow Up Recommendations  Home health OT       Equipment Recommendations  None recommended by OT          Plan Discharge plan remains appropriate    Precautions / Restrictions Precautions Precautions: Fall Restrictions Weight Bearing Restrictions: No Other Position/Activity Restrictions: WBAT       ADL  Lower Body Dressing: Performed;Minimal assistance;Other (comment) (with reacher and sock aid) Where Assessed - Lower Body Dressing: Unsupported sit to stand Toilet Transfer: Performed;Supervision/safety Toilet Transfer Method: Sit to stand Toilet Transfer Equipment: Grab bars;Comfort height toilet Toileting - Clothing Manipulation and Hygiene: Performed;Supervision/safety Where Assessed - Toileting Clothing Manipulation and Hygiene: Standing Transfers/Ambulation Related to ADLs: Pt educated in use of AE. Pt does have AE in which he will get out when he gets home. Son will help pt upon DC ADL Comments: Pt left sitting on the toilet- nursing aware.      OT Goals ADL Goals ADL Goal: Lower Body Dressing - Progress: Progressing toward goals ADL Goal: Toilet Transfer - Progress: Progressing toward goals ADL Goal: Toileting - Clothing Manipulation - Progress: Progressing toward goals  Visit Information  Last OT Received On: 02/06/13    Subjective Data  Subjective: I am doing better. I have a reacher and sock aid      Cognition  Cognition Overall Cognitive Status: Appears within functional limits for tasks assessed/performed Arousal/Alertness: Awake/alert Orientation Level: Appears intact for tasks assessed Behavior During Session: [facility] for tasks performed    Mobility  Bed Mobility Bed  Mobility: Not assessed Supine to Sit: 4: Min assist;HOB elevated Transfers Transfers: Sit to Stand;Stand to Sit Sit to Stand: 5: Supervision;From chair/3-in-1;From toilet;With upper extremity assist Stand to Sit: 5: Supervision;To chair/3-in-1;To bed;To toilet;With upper extremity assist          End of Session OT - End of Session Activity Tolerance: Patient tolerated treatment well Patient left: in bed;Other (comment);with call bell/phone within reach;with nursing in room (sitting EOB)       Artemus Romanoff, [place]ung 02/06/2013, 11:13 AM

## 2013-02-06 NOTE — Discharge Summary (Signed)
Patient ID: Vincent Herrera MRN: [id number] DOB/AGE: [birth year] 57 y.o.  Admit date: 02/03/2013 Discharge date: 02/06/2013  Admission Diagnoses:  Principal Problem:   Degenerative arthritis of hip   Discharge Diagnoses:  Same  Past Medical History  Diagnosis Date  . Arthritis   . 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 Anginal pain     anxiety related/ NEG ekg 10 yrs ago per patient, , no further cardiac testing  . GERD (gastroesophageal reflux disease)     ocas- otc med if needed  . Urinary frequency   . No blood products     pt is jehovah's witness    Surgeries: Procedure(s): Right TOTAL HIP ARTHROPLASTY ANTERIOR APPROACH on 02/03/2013   Consultants:    Discharged Condition: Improved  Hospital Course: Vincent Herrera is an 57 y.o. male who was admitted 02/03/2013 for operative treatment ofDegenerative arthritis of hip. Patient has severe unremitting pain that affects sleep, daily activities, and work/hobbies. After pre-op clearance the patient was taken to the operating room on 02/03/2013 and underwent  Procedure(s): Right TOTAL HIP ARTHROPLASTY ANTERIOR APPROACH.    Patient was given perioperative antibiotics: Anti-infectives   Start     Dose/Rate Route Frequency Ordered Stop   02/03/13 1600  ceFAZolin (ANCEF) IVPB 1 g/50 mL premix     1 g 100 mL/hr over 30 Minutes Intravenous Every 6 hours 02/03/13 1302 02/03/13 2257   02/03/13 0658  ceFAZolin (ANCEF) IVPB 2 g/50 mL premix     2 g 100 mL/hr over 30 Minutes Intravenous On call to O.R. 02/03/13 4098 02/03/13 0941       Patient was given sequential compression devices, early ambulation, and chemoprophylaxis to prevent DVT.  Patient benefited maximally from hospital stay and there were no complications.    Recent vital signs: Patient Vitals for the past 24 hrs:  BP Temp Temp src Pulse Resp SpO2  02/06/13 0455 133/84 mmHg 98.5 F (36.9 C) Oral 78 20 92 %  02/06/13 0400 - - - - 16 98 %   02/06/13 0000 - - - - 16 98 %  02/05/13 2230 116/78 mmHg 98.2 F (36.8 C) Oral 87 16 90 %  02/05/13 2000 - - - - 18 99 %     Recent laboratory studies:  Recent Labs  02/04/13 0525 02/05/13 0414 02/06/13 0427  WBC 7.3 12.4* 10.8*  HGB 11.1* 11.5* 10.3*  HCT 33.6* 34.5* 30.0*  PLT 233 223 209  NA 132* 133*  --   K 3.6 3.4*  --   CL 96 98  --   CO2 29 29  --   BUN 10 9  --   CREATININE 1.00 0.79  --   GLUCOSE 116* 141*  --   CALCIUM 8.4 8.8  --      Discharge Medications:     Medication List    STOP taking these medications       aspirin 325 MG tablet      TAKE these medications       aspirin 325 MG EC tablet  Take 1 tablet (325 mg total) by mouth 2 (two) times daily after a meal.     HYDROcodone-acetaminophen 10-325 MG per tablet  Commonly known as:  NORCO  Take 1 tablet by mouth every 6 (six) hours as needed for pain.     methocarbamol 500 MG tablet  Commonly known as:  ROBAXIN  Take 1 tablet (500 mg total) by mouth 4 (  four) times daily as needed.     oxyCODONE-acetaminophen 5-325 MG per tablet  Commonly known as:  ROXICET  Take 1-2 tablets by mouth every 4 (four) hours as needed for pain.     PRESCRIPTION MEDICATION  Pt states he is taking a muscle relaxant pill every 6 hours - does not know name of medication     traMADol 50 MG tablet  Commonly known as:  ULTRAM  Take 50 mg by mouth every 6 (six) hours as needed for pain.     VITAMIN C PO  Take by mouth. One daily        Diagnostic Studies: Dg Chest 2 View  01/26/2013  *RADIOLOGY REPORT*  Clinical Data: Preoperative respiratory films.  CHEST - 2 VIEW  Comparison: PA and lateral chest 12/08/2005.  Findings: Lungs are clear.  Heart size is normal.  No pneumothorax or pleural effusion.  No focal bony abnormality with multilevel thoracic degenerative change seen.  IMPRESSION: No acute abnormality.   Original Report Authenticated By: Holley Dexter, M.D.    Dg Hip Complete Right  02/03/2013   *RADIOLOGY REPORT*  Clinical Data: Right hip total replacement  RIGHT HIP - COMPLETE 2+ VIEW  Comparison: Plain film 10/05/2012  Findings: Interval right total hip arthroplasty.  No evidence of fracture or dislocation.  IMPRESSION: No complication following right hip arthroplasty.   Original Report Authenticated By: Genevive Bi, M.D.    Dg Pelvis Portable  02/03/2013  *RADIOLOGY REPORT*  Clinical Data: 57 year old male status post anterior approach right total hip arthroplasty.  PORTABLE PELVIS  Comparison: Intraoperative images 0959 hours the same day and earlier.  Findings: Portable AP view of the pelvis at 1154 hours.  Right hip arthroplasty.  Hardware components appear intact normally aligned. Stable sequelae of left hip arthroplasty.  Pelvis appears intact. No unexpected osseous changes identified.  IMPRESSION: Right total hip arthroplasty with no adverse features identified.   Original Report Authenticated By: Erskine Speed, M.D.    Dg Hip Portable 1 View Right  02/03/2013  *RADIOLOGY REPORT*  Clinical Data: 57 year old male status post anterior approach right total hip replacement.  PORTABLE RIGHT HIP - 1 VIEW  Comparison: Intraoperative images 0959 hours the same day. 10/07/2012.  Findings: Portable cross-table lateral view.  Right hip arthroplasty hardware appears normally aligned and intact. Sequelae of left hip arthroplasty again noted.  IMPRESSION: No adverse features identified status post right hip arthroplasty.   Original Report Authenticated By: Erskine Speed, M.D.    Dg C-arm 1-60 Min-no Report  02/03/2013  CLINICAL DATA: right total hip   C-ARM 1-60 MINUTES  Fluoroscopy was utilized by the requesting physician.  No radiographic  interpretation.      Disposition: 06-Home-Health Care Svc      Discharge Orders   Future Orders Complete By Expires     Call MD / Call 911  As directed     Comments:      If you experience chest pain or shortness of breath, CALL 911 and be transported to  the hospital emergency room.  If you develope a fever above 101 F, pus (white drainage) or increased drainage or redness at the wound, or calf pain, call your surgeon's office.    Constipation Prevention  As directed     Comments:      Drink plenty of fluids.  Prune juice may be helpful.  You may use a stool softener, such as Colace (over the counter) 100 mg twice a day.  Use MiraLax (  over the counter) for constipation as needed.    Diet - low sodium heart healthy  As directed     Discharge instructions  As directed     Comments:      Increase activities as comfort allows. Wait until 02/08/13 to get your incision wet in the shower; then dry dressing daily.    Discharge patient  As directed     Increase activity slowly as tolerated  As directed        Follow-up Information   Follow up with Kathryne Hitch, MD In 2 weeks.   Contact information:   [address] [place] [address] [phone]        Signed: Kathryne Hitch 02/06/2013, 7:16 AM

## 2013-02-06 NOTE — Progress Notes (Signed)
Physical Therapy Treatment Patient Details Name: Vincent Herrera MRN: [id number] DOB: 01/28/1956 Today's Date: 02/06/2013 Time: 4098-1191 PT Time Calculation (min): 27 min  PT Assessment / Plan / Recommendation Comments on Treatment Session       Follow Up Recommendations        Does the patient have the potential to tolerate intense rehabilitation     Barriers to Discharge        Equipment Recommendations       Recommendations for Other Services    Frequency     Plan      Precautions / Restrictions Precautions Precautions: Fall Restrictions Weight Bearing Restrictions: No Other Position/Activity Restrictions: WBAT   Pertinent Vitals/Pain     Mobility  Bed Mobility Bed Mobility: Supine to Sit Supine to Sit: 5: Supervision Details for Bed Mobility Assistance: Min cues for hand placement and to use bed instead of trapeze.  Transfers Transfers: Sit to Stand;Stand to Sit;Stand Pivot Transfers Sit to Stand: 5: Supervision;From elevated surface;From bed Stand to Sit: 5: Supervision;With upper extremity assist Details for Transfer Assistance: Very min cues for hand placement and safety.  Ambulation/Gait Ambulation/Gait Assistance: 5: Supervision Ambulation Distance (Feet): 200 Feet Assistive device: Rolling walker Ambulation/Gait Assistance Details: Min cues for upright and relaxed posture.  Pt able to transition to step through gait pattern during amb. Gait Pattern: Step-to pattern;Antalgic;Trunk flexed Gait velocity: decreased Stairs: Yes Stairs Assistance: 4: Min guard Stair Management Technique: One rail Right;Forwards;Step to pattern;With crutches (single crutch) Number of Stairs: 5    Exercises Total Joint Exercises Ankle Circles/Pumps: AROM;Both;20 reps Quad Sets: AROM;Right;10 reps Heel Slides: AAROM;Right;10 reps   PT Diagnosis:    PT Problem List:   PT Treatment Interventions:     PT Goals    Visit Information  Last PT Received On:  02/06/13 Assistance Needed: +1    Subjective Data  Subjective: I want to go home today.  Patient Stated Goal: to return home.    Cognition  Cognition Overall Cognitive Status: Appears within functional limits for tasks assessed/performed Arousal/Alertness: Awake/alert Orientation Level: Appears intact for tasks assessed Behavior During Session: [facility] for tasks performed    Balance     End of Session     GP     Vista Deck 02/06/2013, 12:19 PM

## 2013-02-06 NOTE — Progress Notes (Signed)
Occupational Therapy Treatment Patient Details Name: Vincent Herrera MRN: [id number] DOB: 1956-03-22 Today's Date: 02/06/2013 Time: 1308-6578 OT Time Calculation (min): 13 min  OT Assessment / Plan / Recommendation Comments on Treatment Session pt limited byu abdominal discomfort    Follow Up Recommendations  Home health OT       Equipment Recommendations  None recommended by OT          Plan Discharge plan remains appropriate    Precautions / Restrictions Precautions Precautions: Fall Restrictions Weight Bearing Restrictions: No Other Position/Activity Restrictions: WBAT       ADL  Toilet Transfer: Performed;Minimal assistance Toilet Transfer Method: Sit to stand;Other (comment) (walking to bathroom) Toilet Transfer Equipment: Comfort height toilet Toileting - Clothing Manipulation and Hygiene: Minimal assistance Where Assessed - Toileting Clothing Manipulation and Hygiene: Standing;Sit on 3-in-1 or toilet ADL Comments: Pt left sitting on the toilet- nursing aware.      OT Goals ADL Goals ADL Goal: Toilet Transfer - Progress: Progressing toward goals  Visit Information  Last OT Received On: 02/06/13    Subjective Data  Subjective: my stomach hurts and i am going to throw up      Cognition  Cognition Overall Cognitive Status: Appears within functional limits for tasks assessed/performed Arousal/Alertness: Awake/alert Orientation Level: Appears intact for tasks assessed Behavior During Session: [facility] for tasks performed    Mobility  Bed Mobility Bed Mobility: Supine to Sit Supine to Sit: 4: Min assist;HOB elevated Transfers Transfers: Sit to Stand;Stand to Sit Sit to Stand: 4: Min guard;From bed;With upper extremity assist Stand to Sit: 4: Min guard;To toilet          End of Session OT - End of Session Activity Tolerance: Patient limited by pain  GO     Vincent Herrera 02/06/2013, 9:38 AM

## 2014-03-22 IMAGING — CR DG HIP 1V PORT*L*
1 series · 1 of 1 positions shown · non-contrast
Comparison: Intraoperative films from earlier the same day.

CLINICAL DATA: Postop hip replacement

PORTABLE LEFT HIP - 1 VIEW

[AP]
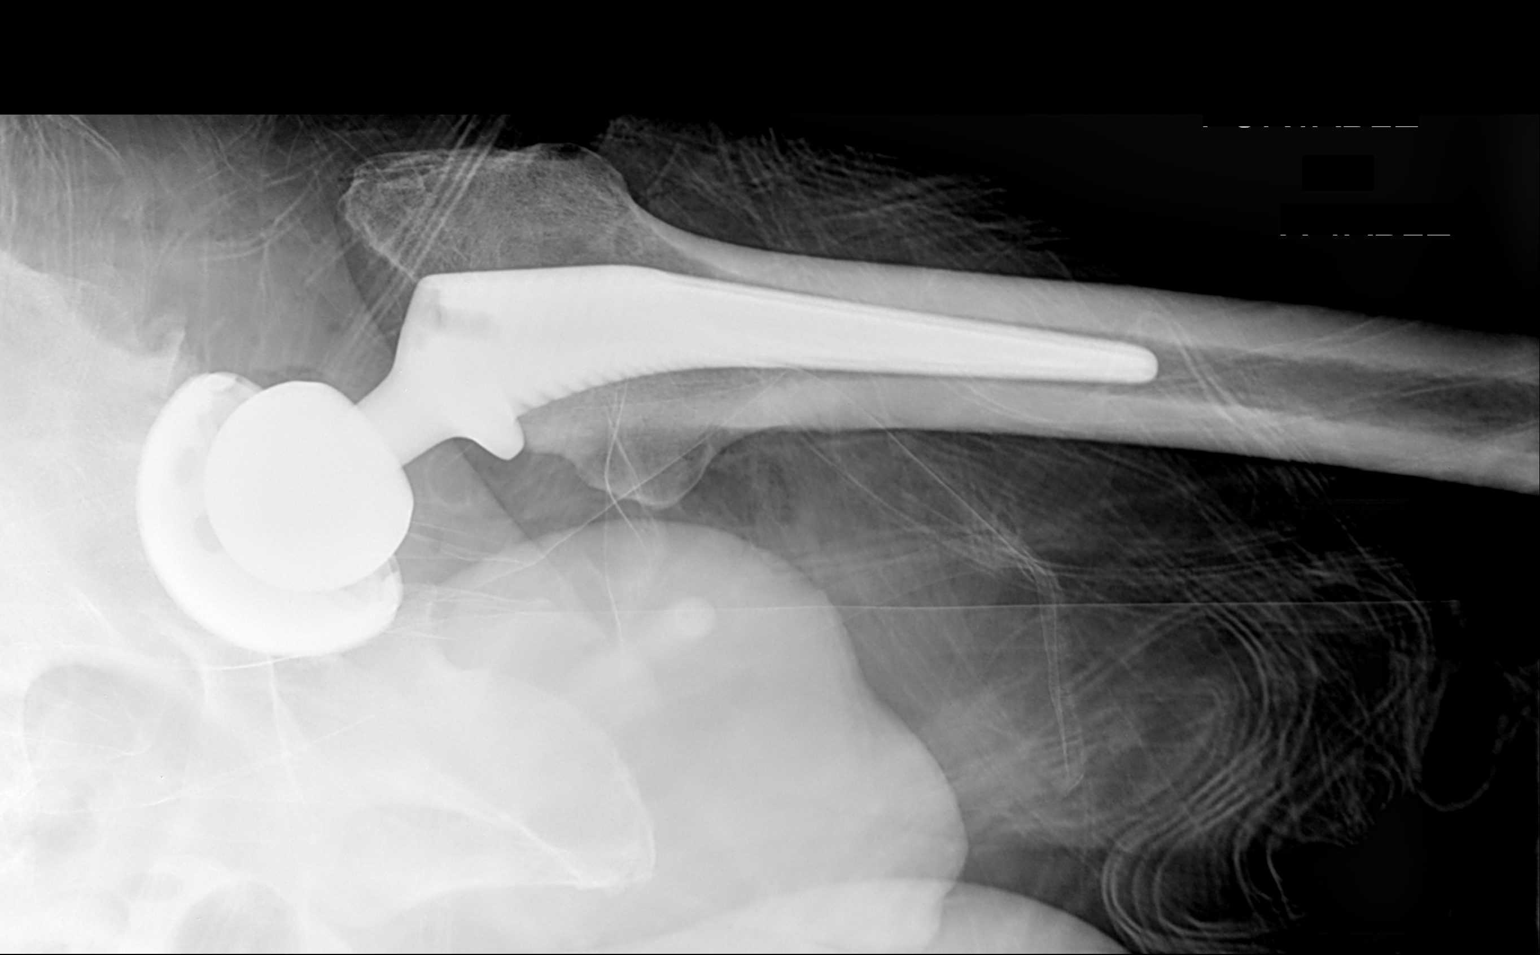

[1 of 1 positions shown; findings below may reference images not displayed]

FINDINGS: Cross-table lateral view of the left hip obtained
portably at 5305 hours shows the patient be status post total hip
replacement.  Femoral component is appropriate position within the
acetabular cup.  No evidence for immediate hardware complications.
IMPRESSION: Status post left total hip replacement without evidence for
immediate hardware complications.

## 2014-03-22 IMAGING — CR DG PORTABLE PELVIS
1 series · 1 of 1 positions shown · non-contrast
Comparison: 04/25/2012

CLINICAL DATA: Postop left hip replacement

PORTABLE PELVIS

[AP]
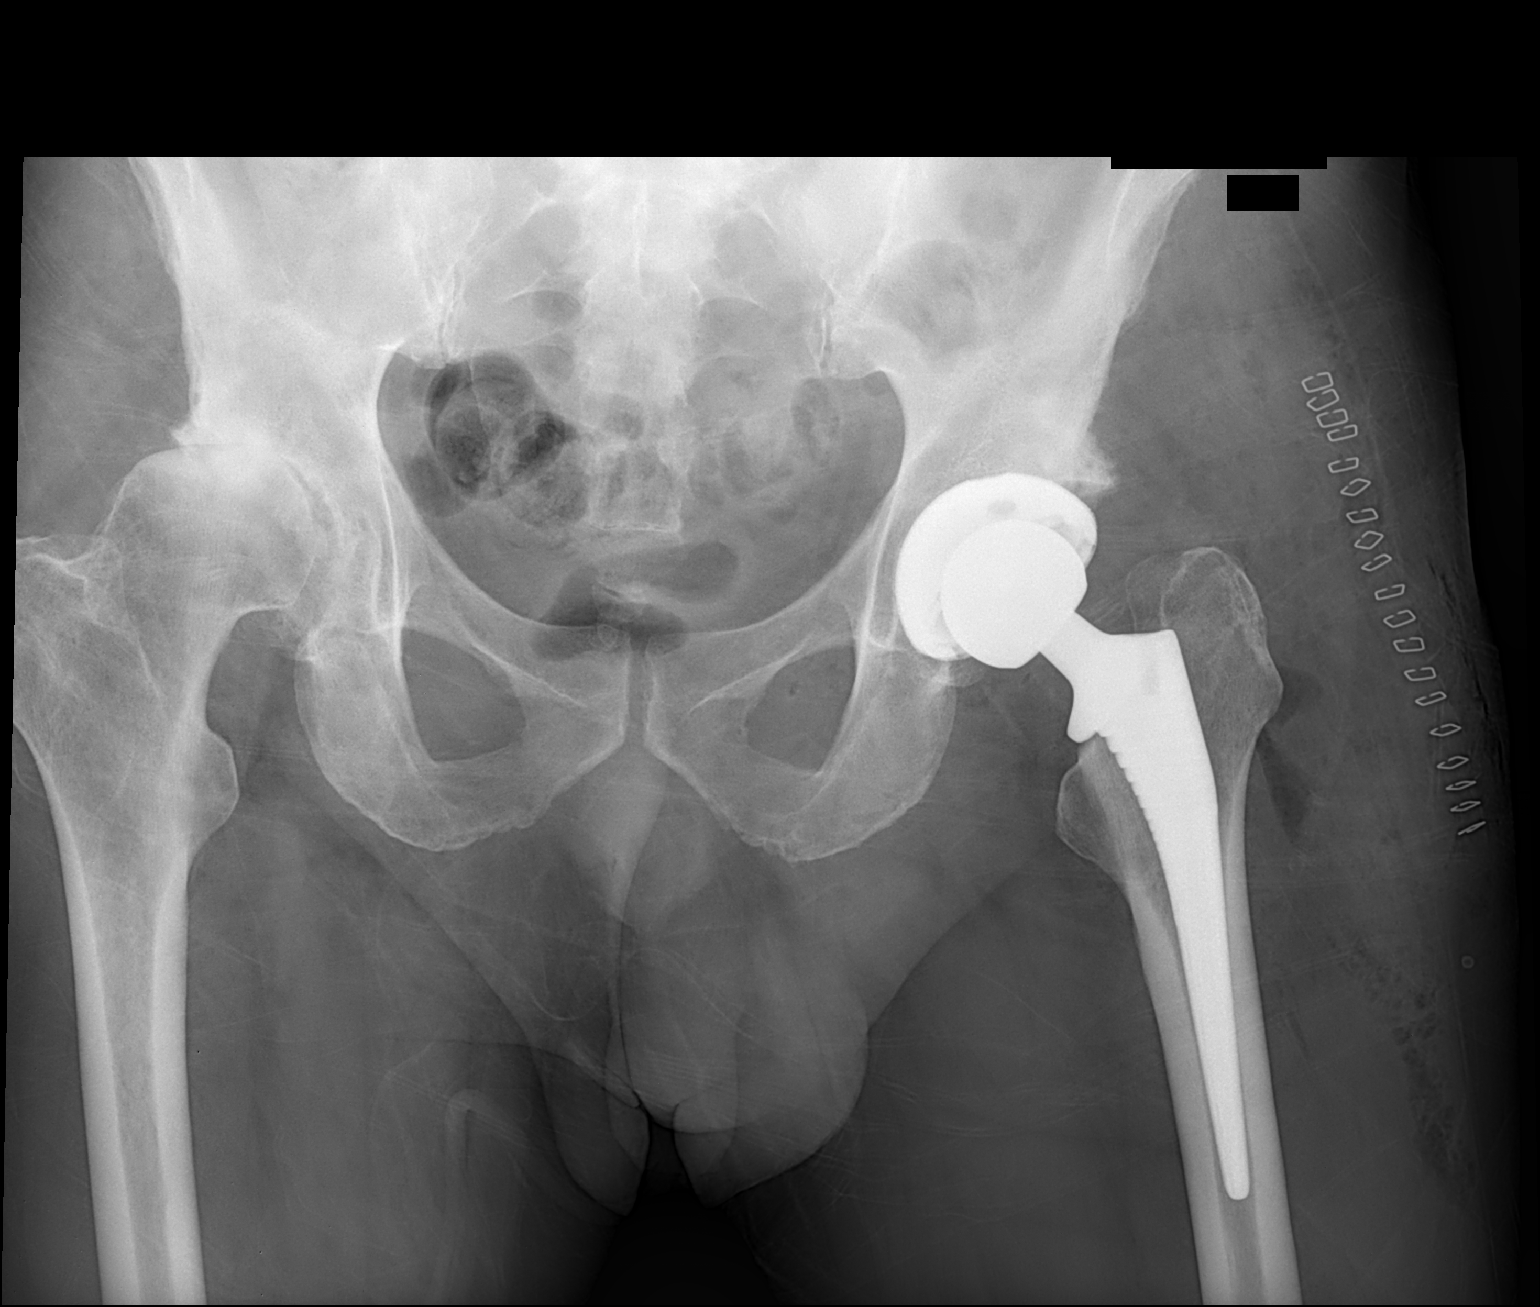

[1 of 1 positions shown; findings below may reference images not displayed]

FINDINGS: Portable film at 1466 hours shows the patient to be
immediately status post left total hip replacement.  No evidence
for hardware complications.  Gas in the soft tissues of the left
hip region is compatible with the recent surgery.  Advanced changes
of osteoarthritis are seen in the right hip.
IMPRESSION: Status post left total hip replacement without evidence for
hardware complications.

## 2014-07-11 IMAGING — CR DG CHEST 2V
2 series · 2 of 2 positions shown · non-contrast
Comparison: PA and lateral chest 12/08/2005.

CLINICAL DATA: Preoperative respiratory films.

CHEST - 2 VIEW

[w chest pa]
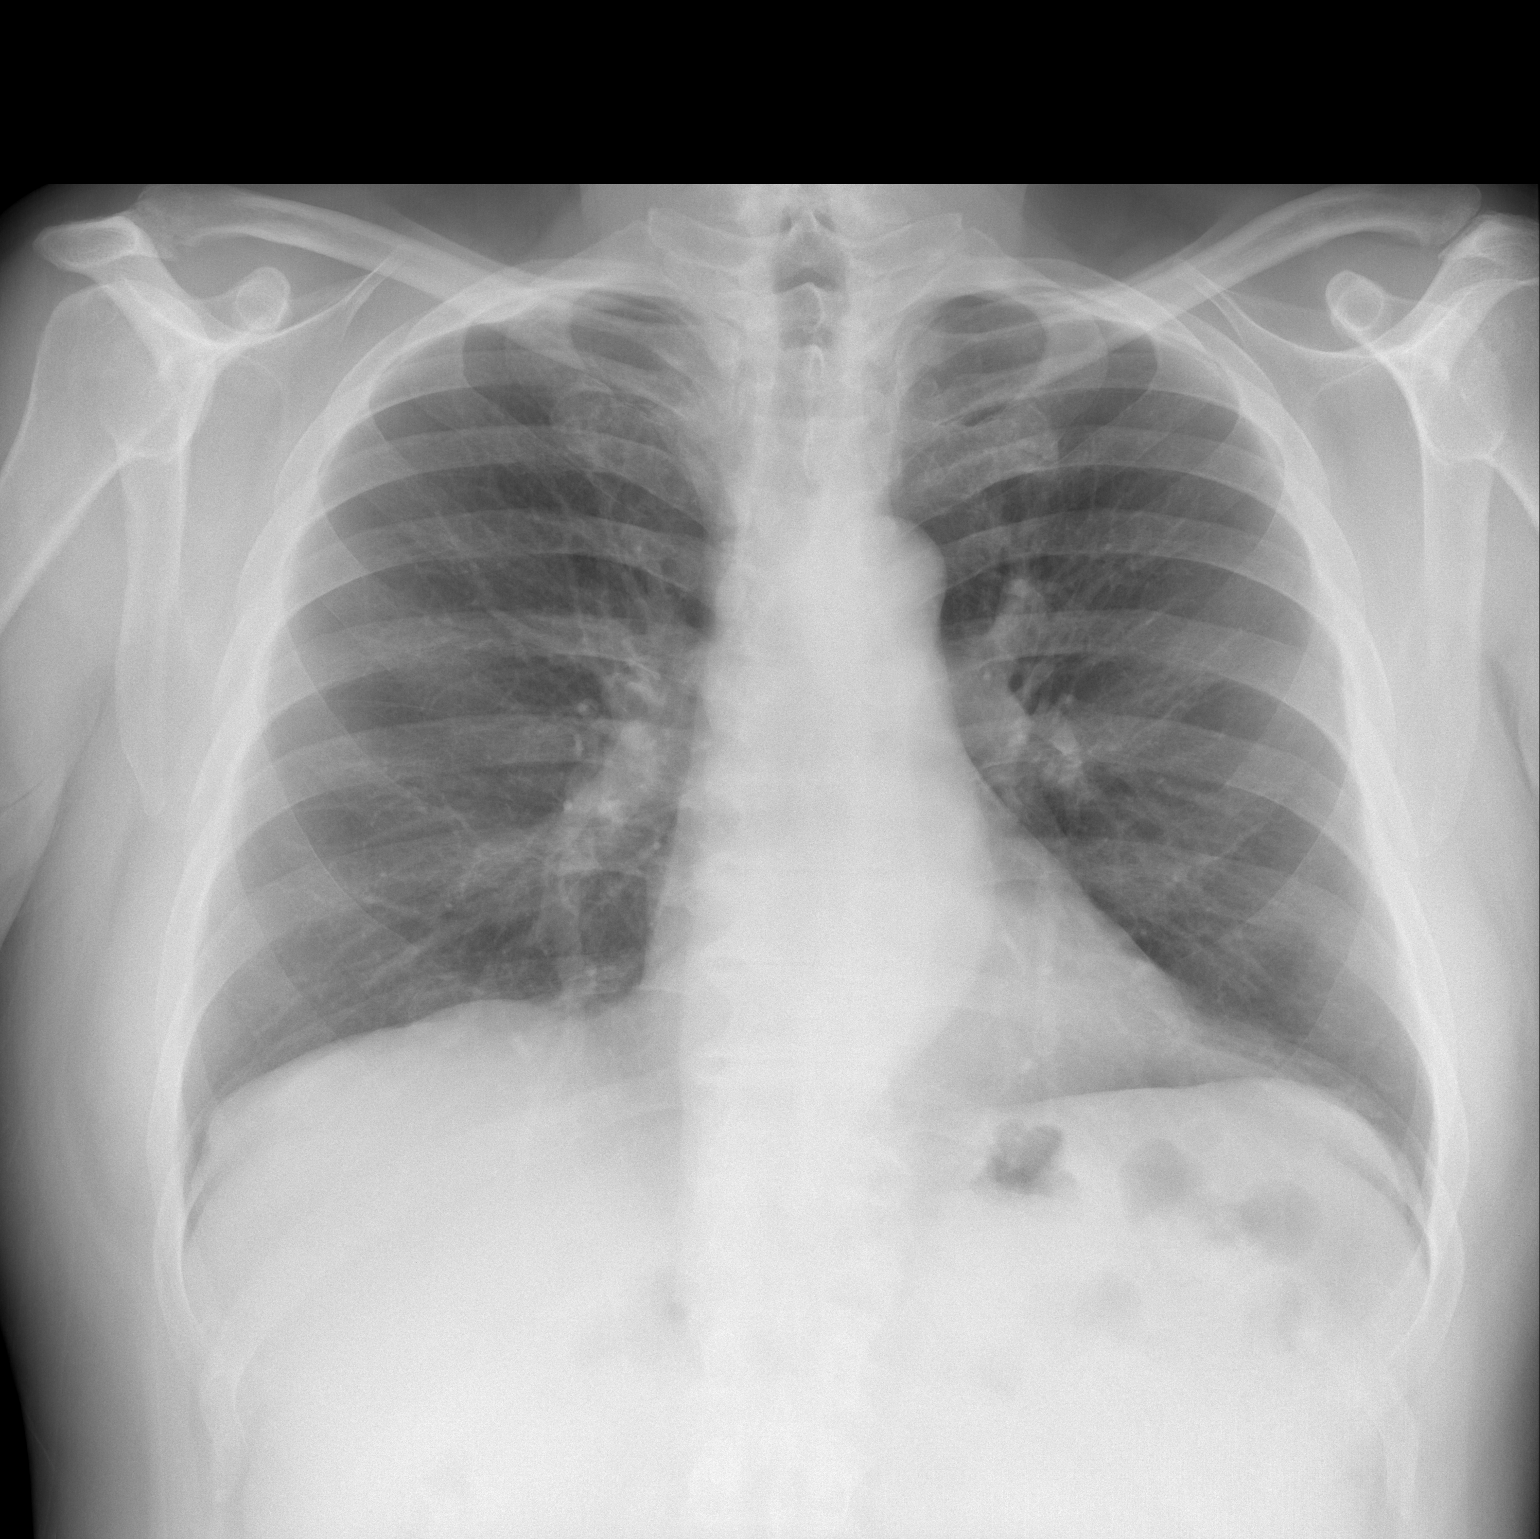

[w chest lat]
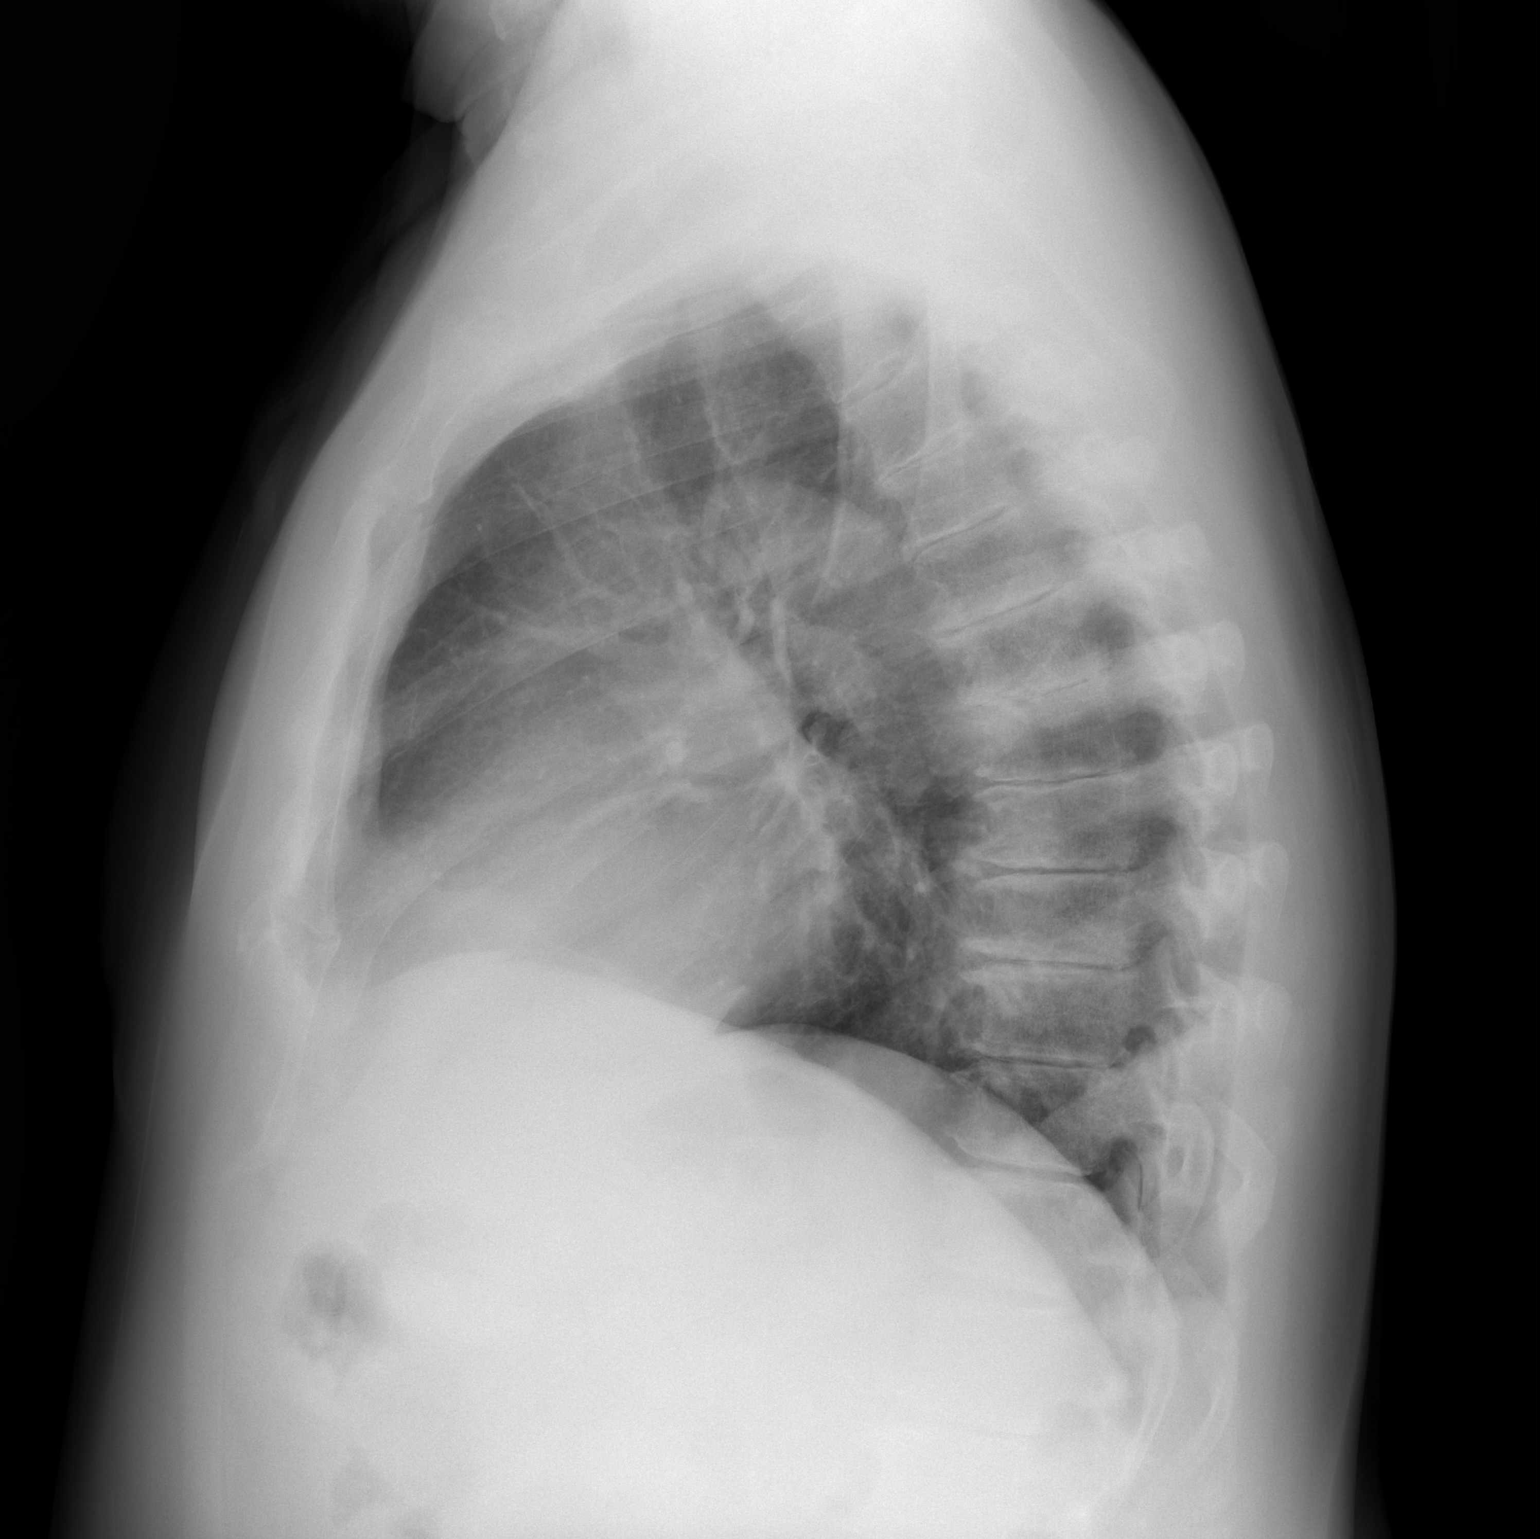

[2 of 2 positions shown; findings below may reference images not displayed]

FINDINGS: Lungs are clear.  Heart size is normal.  No pneumothorax
or pleural effusion.  No focal bony abnormality with multilevel
thoracic degenerative change seen.
IMPRESSION: No acute abnormality.

## 2021-05-02 ENCOUNTER — Other Ambulatory Visit: Payer: Self-pay | Admitting: Urology

## 2021-05-02 DIAGNOSIS — N403 Nodular prostate with lower urinary tract symptoms: Secondary | ICD-10-CM

## 2021-05-02 DIAGNOSIS — R972 Elevated prostate specific antigen [PSA]: Secondary | ICD-10-CM

## 2021-05-02 DIAGNOSIS — C61 Malignant neoplasm of prostate: Secondary | ICD-10-CM

## 2021-05-15 ENCOUNTER — Other Ambulatory Visit: Payer: Self-pay

## 2021-05-15 ENCOUNTER — Encounter (HOSPITAL_COMMUNITY)
Admission: RE | Admit: 2021-05-15 | Discharge: 2021-05-15 | Disposition: A | Discharge status: Home / Self Care | Payer: Commercial Managed Care - PPO | Source: Ambulatory Visit | Attending: Urology | Admitting: Urology

## 2021-05-15 DIAGNOSIS — R972 Elevated prostate specific antigen [PSA]: Secondary | ICD-10-CM | POA: Diagnosis present

## 2021-05-15 DIAGNOSIS — C61 Malignant neoplasm of prostate: Secondary | ICD-10-CM | POA: Diagnosis not present

## 2021-05-15 DIAGNOSIS — N403 Nodular prostate with lower urinary tract symptoms: Secondary | ICD-10-CM

## 2021-05-15 MED ORDER — TECHNETIUM TC 99M MEDRONATE IV KIT
18.6000 | PACK | Freq: Once | INTRAVENOUS | Status: AC
  Administered 2021-05-15: 18.6 via INTRAVENOUS

## 2021-05-27 ENCOUNTER — Encounter: Payer: Self-pay | Admitting: Medical Oncology

## 2021-05-27 NOTE — Progress Notes (Signed)
I called pt to introduce myself as the Prostate Nurse Navigator and the Coordinator of the [facility].  1. I confirmed with the patient he is aware of his referral to the clinic 7/26, arriving @ 12:30 pm.   2. I discussed the format of the clinic and the physicians he will be seeing that day.  3. I discussed where the clinic is located and how to contact me.  4. I confirmed his address and informed him I would be mailing a packet of information and forms to be completed. I asked him to bring them with him the day of his appointment.   He voiced understanding of the above. I asked him to call me if he has any questions or concerns regarding his appointments or the forms he needs to complete.

## 2021-06-03 ENCOUNTER — Encounter: Payer: Self-pay | Admitting: Medical Oncology

## 2021-06-05 ENCOUNTER — Encounter: Payer: Self-pay | Admitting: Medical Oncology

## 2021-06-09 ENCOUNTER — Encounter: Payer: Self-pay | Admitting: Medical Oncology

## 2021-06-09 NOTE — Progress Notes (Signed)
Patient left message asking about location of [place]. I returned call and left directions and asked him to call back if he has further questions.

## 2021-06-09 NOTE — Progress Notes (Signed)
Left message as reminder of [place] appointment 7/26, arriving @ 12:30 pm. I reviewed location, COVID protocol and reminded him to bring his completed medical forms. I asked him to call me if he has questions or concerns about the clinic.

## 2021-06-10 ENCOUNTER — Ambulatory Visit
Admission: RE | Admit: 2021-06-10 | Discharge: 2021-06-10 | Disposition: A | Discharge status: Home / Self Care | Payer: Commercial Managed Care - PPO | Source: Ambulatory Visit | Attending: Radiation Oncology | Admitting: Radiation Oncology

## 2021-06-10 ENCOUNTER — Inpatient Hospital Stay: Payer: Commercial Managed Care - PPO | Attending: Oncology | Admitting: Oncology

## 2021-06-10 ENCOUNTER — Other Ambulatory Visit: Payer: Self-pay

## 2021-06-10 ENCOUNTER — Encounter: Payer: Self-pay | Admitting: Medical Oncology

## 2021-06-10 ENCOUNTER — Encounter: Payer: Self-pay | Admitting: General Practice

## 2021-06-10 DIAGNOSIS — M47816 Spondylosis without myelopathy or radiculopathy, lumbar region: Secondary | ICD-10-CM | POA: Diagnosis not present

## 2021-06-10 DIAGNOSIS — C61 Malignant neoplasm of prostate: Secondary | ICD-10-CM | POA: Insufficient documentation

## 2021-06-10 DIAGNOSIS — K219 Gastro-esophageal reflux disease without esophagitis: Secondary | ICD-10-CM | POA: Diagnosis not present

## 2021-06-10 DIAGNOSIS — F17210 Nicotine dependence, cigarettes, uncomplicated: Secondary | ICD-10-CM | POA: Insufficient documentation

## 2021-06-10 DIAGNOSIS — Z79899 Other long term (current) drug therapy: Secondary | ICD-10-CM | POA: Insufficient documentation

## 2021-06-10 DIAGNOSIS — Z7982 Long term (current) use of aspirin: Secondary | ICD-10-CM | POA: Diagnosis not present

## 2021-06-10 NOTE — Consult Note (Signed)
Multi-Disciplinary Clinic     06/10/2021   --------------------------------------------------------------------------------   Vincent Herrera  MRN: [id number]  DOB: [birth year], 65 year old Male  SSN:    PRIMARY CARE:  Ardelia Mems. Jimmye Norman, MD  REFERRING:  Irine Seal, MD  PROVIDER:  Irine Seal, M.D.  TREATING:  Vincent Herrera, M.D.  LOCATION:  [facility] [phone]     --------------------------------------------------------------------------------   CC/HPI: CC: Prostate Cancer   Physician requesting consult: Dr. Irine Seal  PCP: Dr. York Ram  Location of consult: [facility] - Prostate Cancer Multidisciplinary Clinic   Vincent Herrera is a 65 year old gentleman with a medical history significant for anxiety, hypertension, arthritis, and chronic pain (oxycodone) who was found to have an elevated PSA of 11.1 prompting a TRUS biopsy of the prostate on 04/23/21. His biopsy indicated Gleason 3+4=7 adenocarcinoma with 11 out of 12 biopsy cores and there was concern for locally advanced disease on ultrasound with capsular bulging on the left. He underwent staging imaging with a bone scan (05/15/21) with minimal uptake of the mid/lower thoracic and lumbar spine likely degenerative as well as a CT scan of the abdomen and pelvis (05/02/21) that demonstrated no lymphadenopathy.   Family history: None.   Imaging studies:  Bone scan (05/15/21): See above.  CT abd/pelvis (05/02/21): See above.   PMH: He has a history of anxiety, hypertension, arthritis, and chronic pain (on oxycodone prn).  PSH: No abdominal surgeries.   TNM stage: cT3a N0 M0  PSA: 11.1  Gleason score: 3+4=7 (GG 2)  Biopsy (04/23/21): 11/12 cores positive  Left: L lateral apex (80%, 3+4=7, PNI), L apex (50%, 3+4=7, PNI), L lateral mid (90%, 3+4=7, PNI), L mid (60%, 3+4=7, PNI), L lateral base (60%, 3+4=7, PNI), L base (60%, 3+4=7, PNI)  Right: R apex (40%, 3+4=7), R mid (90%, 3+4=7), R lateral mid  (30%, 3+3=6), R base (10%, 3+3=6), R lateral base (10%, 3+3=6)  Prostate volume: 18 cc   Nomogram  OC disease: 10%  EPE: 90%  SVI: 27%  LNI: 22%  PFS (5 year, 10 year): 47%, 32%   Urinary function: IPSS is 13.  Erectile function: SHIM score is 10.     ALLERGIES: No Allergies    MEDICATIONS: Aspirin PRN  Alprazolam 1 mg tablet  Ibuprofen PRN  Levofloxacin 750 mg tablet 1 po 1 hour prior to the procedure  Methocarbamol 750 mg tablet  Olmesartan-Hydrochlorothiazide  Oxycodone-Acetaminophen 10 mg-325 mg tablet     GU PSH: Locm 300-'399Mg'$ /Ml Iodine,1Ml - 05/02/2021 Prostate Needle Biopsy - 04/23/2021       PSH Notes: Nasal reconstruction surgery   NON-GU PSH: Hip Replacement Surgical Pathology, Gross And Microscopic Examination For Prostate Needle - 04/23/2021     GU PMH: Prostate Cancer, T2c/3 N0 M? 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 I discussed treatment options with him and explained that he is a poor candidate for active surveillance. I discussed RALP and reviewed the risks in detail and explained that he would have a very high risk of requiring multimodal therapy as the probability of locally advanced disease is high. I reviewed the risks of radiation therapy with him. I believe her would need EXRT w/wo a seed boost and adjuvant ADT for about 6-8 months. I reviewed the side effects of ADT and the risks and benefits of radiation therapy in detail. At this time I will plan to have him seen in the [facility] to  further discuss treatment options but will try to make sure that is scheduled so that the bone scan will be available. He is a surgical candidate based on current evidence but I believe he might be better served with the radiation and ADT with the probably locally advanced nature of his disease. I also discussed the need for Gold seeds and SpaceOAR and reviewed the added risks with that procedure. - 05/02/2021 Elevated PSA, He has a large  hypoechoic lesion of the left PZ in a small prostate and has a high probabilty of a T2c/T3 prostate cancer. I will notify him of the results and obtain staging as indicated. - 04/23/2021, He has an elevated PSA with a nodular prostate that would be consistent with a T2c lesion if he has cancer. He needs a prostate [place] and biopsy and I have reviewed the risks of bleeding, infection and voiding difficulty. Levaquin sent. I will also repeat a PSA today and a testosterone level since he is on chronic opioids. , - 03/07/2021 Prostate nodule w/ LUTS - 04/23/2021, - 03/07/2021 Weak Urinary Stream - 03/07/2021    NON-GU PMH: Anxiety Arthritis Chronic pain syndrome Hypertension    FAMILY HISTORY: Death of family member - Father, Mother   SOCIAL HISTORY: Marital Status: Single Preferred Language: English; Ethnicity: Not Hispanic Or Latino; Race: White Current Smoking Status: Patient does not smoke anymore. Has not smoked since 02/15/2019. Smoked for 40 years.   Tobacco Use Assessment Completed: Used Tobacco in last 30 days? Does not use smokeless tobacco. Light Drinker.  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    REVIEW OF SYSTEMS:    GU Review Male:   Patient denies frequent urination, hard to postpone urination, burning/ pain with urination, get up at night to urinate, leakage of urine, stream starts and stops, trouble starting your streams, and have to strain to urinate .  Gastrointestinal (Lower):   Patient denies diarrhea and constipation.  Gastrointestinal (Upper):   Patient denies nausea and vomiting.  Constitutional:   Patient denies fever, night sweats, weight loss, and fatigue.  Skin:   Patient denies skin rash/ lesion and itching.  Eyes:   Patient denies blurred vision and double vision.  Ears/ Nose/ Throat:   Patient denies sore throat and sinus problems.  Hematologic/Lymphatic:   Patient denies swollen glands and easy bruising.  Cardiovascular:   Patient denies leg swelling and chest  pains.  Respiratory:   Patient denies cough and shortness of breath.  Endocrine:   Patient denies excessive thirst.  Musculoskeletal:   Patient denies back pain and joint pain.  Neurological:   Patient denies headaches and dizziness.  Psychologic:   Patient denies depression and anxiety.   VITAL SIGNS: None   GU PHYSICAL EXAMINATION:    Prostate: Prostate about 40 grams. He has a very concerning exam with firmness along the entire base of the prostate but extending toward the left side of the prostate with very likely extraprostatic extension.   MULTI-SYSTEM PHYSICAL EXAMINATION:    Constitutional: Well-nourished. No physical deformities. Normally developed. Good grooming.  Respiratory: No labored breathing, no use of accessory muscles. Clear bilaterally.  Cardiovascular: Normal temperature, normal extremity pulses, no swelling, no varicosities. Regular rate and rhythm.  Gastrointestinal: No mass, no tenderness, no rigidity, mildly obese abdomen.      Complexity of Data:  Lab Test Review:   PSA  Records Review:   Pathology Reports, Previous Patient Records  X-Ray Review: C.T. Abdomen/Pelvis: Reviewed Films.  Bone Scan: Reviewed Films.  03/07/21  PSA  Total PSA 8.96 ng/mL    PROCEDURES: None   ASSESSMENT:      ICD-10 Details  1 GU:   Prostate Cancer - C61    PLAN:           Document Letter(s):  Created for Patient: Clinical Summary         Notes:   1. Locally advanced prostate cancer: I had a detailed discussion with Mr. Isaiah regarding his locally advanced prostate cancer. I strongly recommended aggressive therapy of curative intent considering his disease parameters and life expectancy.   The patient was counseled about the natural history of prostate cancer and the standard treatment options that are available for prostate cancer. It was explained to him how his age and life expectancy, clinical stage, Gleason score, and PSA affect his prognosis, the decision to  proceed with additional staging studies, as well as how that information influences recommended treatment strategies. We discussed the roles for active surveillance, radiation therapy, surgical therapy, androgen deprivation, as well as ablative therapy options for the treatment of prostate cancer as appropriate to his individual cancer situation. We discussed the risks and benefits of these options with regard to their impact on cancer control and also in terms of potential adverse events, complications, and impact on quality of life particularly related to urinary and sexual function. The patient was encouraged to ask questions throughout the discussion today and all questions were answered to his stated satisfaction. In addition, the patient was provided with and/or directed to appropriate resources and literature for further education about prostate cancer and treatment options. We discussed surgical therapy for prostate cancer including the different available surgical approaches. We discussed, in detail, the risks and expectations of surgery with regard to cancer control, urinary control, and erectile function as well as the expected postoperative recovery process. Additional risks of surgery including but not limited to bleeding, infection, hernia formation, nerve damage, lymphocele formation, bowel/rectal injury potentially necessitating colostomy, damage to the urinary tract resulting in urine leakage, urethral stricture, and the cardiopulmonary risks such as myocardial infarction, stroke, death, venothromboembolism, etc. were explained. The risk of open surgical conversion for robotic/laparoscopic prostatectomy was also discussed.   Ultimately, I recommended that he consider either primary surgical therapy with a non nerve-sparing radical prostatectomy and bilateral lymphadenectomy in the context of probable multimodality treatment with a strong likelihood of requiring adjuvant or salvage radiation  therapy and possibly systemic therapy or primary radiation therapy with long-term androgen deprivation. We reviewed the pros and cons of each of these approaches. He is scheduled to see Dr. Tammi Klippel later this afternoon as well as Dr. Alen Blew. He will notify me if I can be of further assistance to him.   Cc: Dr. York Ram  Dr. Irine Seal  Dr. Tyler Pita  Dr. Zola Button

## 2021-06-10 NOTE — Progress Notes (Signed)
[facility] Psychosocial Distress Screening Spiritual Care  Met with Vincent Herrera in [facility] to introduce [facility] team/resources, reviewing distress screen per protocol.  The patient scored a 3 on the Psychosocial Distress Thermometer which indicates mild distress. Also assessed for distress and other psychosocial needs.   ONCBCN DISTRESS SCREENING 06/10/2021  Distress experienced in past week (1-10) 3  Practical problem type Housing;Insurance;Work/school;Transportation  Emotional problem type Depression;Nervousness/Anxiety;Adjusting to illness;Isolation/feeling alone;Feeling hopeless  Spiritual/Religous concerns type Facing my mortality;Loss of sense of purpose  Physical Problem type Pain;Sleep/insomnia;Breathing;Talking;Tingling hands/feet;Sexual problems;Skin dry/itchy  Referral to support programs Yes   Discussed Vincent Herrera's distress screen with him at length. Insurance is a frustration because of unclear reimbursement patterns. He has two vehicles, but neither is currently reliable. With the exception of anxiety, for which one of his doctors prescribes Xanax, he notes that his emotional and spiritual concerns are all tied to the diagnosis, which he is working to process. He notes that his overall distress is low. He is aware of [facility] team and support programming, including Prostate Cancer Support Group.  Follow up needed: No. Per Vincent Herrera, no other needs at this time. He plans to reach out to team as needed/desired.   [place], [place], [facility] Pager [phone] Voicemail [phone]

## 2021-06-10 NOTE — Progress Notes (Signed)
                               Care Plan Summary  Name: Vincent Herrera DOB: [birth year]   Your Medical Team:   Urologist -  Dr. Raynelle Bring, [facility]  Radiation Oncologist - Dr. Tyler Pita, [facility]   Medical Oncologist - Dr. Zola Button, [place]  Recommendations: 1) Robotic prostatectomy  2) Androgen deprivation (hormone injection) with radiation    * These recommendations are based on information available as of today's consult.      Recommendations may change depending on the results of further tests or exams.  Next Steps: 1) Consider your options and call Vincent Rue, RN with decision     When appointments need to be scheduled, you will be contacted by [facility] and/or [facility].  Questions?  Please do not hesitate to call Vincent Rue, RN, BSN, OCN at [phone] any questions or concerns.  Vincent Herrera is your Oncology Nurse Navigator and is available to assist you while you're receiving your medical care at [facility].

## 2021-06-10 NOTE — Progress Notes (Signed)
Reason for the request:    Prostate cancer  HPI: I was asked by Dr. Jeffie Pollock to evaluate Vincent Herrera for the evaluation of prostate cancer.  He is a 65 year old man with no significant past medical history who was found to have an elevated PSA of 11.1.  Biopsy completed on April 23, 2021 under the care of Dr. Jeffie Pollock showed a Gleason score of 3+4 equal 7 in 11 out of 12 cores being high-volume disease.  He denies any major urinary symptoms at this time.  He denies any urinary frequency urgency or hesitancy.  He denies any hematuria or dysuria.  He denies any bone pain or pathological fractures.  He remains active and continues to activities of daily living.  Imaging studies including a bone scan and CT scan did not show any evidence of metastatic disease.  He does have sclerosis noted consistent with degenerative joint disease.  He does not report any headaches, blurry vision, syncope or seizures. Does not report any fevers, chills or sweats.  Does not report any cough, wheezing or hemoptysis.  Does not report any chest pain, palpitation, orthopnea or leg edema.  Does not report any nausea, vomiting or abdominal pain.  Does not report any constipation or diarrhea.  Does not report any skeletal complaints.    Does not report frequency, urgency or hematuria.  Does not report any skin rashes or lesions. Does not report any heat or cold intolerance.  Does not report any lymphadenopathy or petechiae.  Does not report any anxiety or depression.  Remaining review of systems is negative.     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   No blood products    pt is jehovah's witness   Urinary frequency   :   Past Surgical History:  Procedure Laterality Date    NASAL RECONSTRUCTION     fractured nose   TOTAL HIP ARTHROPLASTY  10/07/2012   Procedure: TOTAL HIP ARTHROPLASTY ANTERIOR APPROACH;  Surgeon: Mcarthur Rossetti, MD;  Location: [facility];  Service: Orthopedics;  Laterality: Left;  Left Total Hip Arthroplasty, Anterior Approach (C-Arm)   TOTAL HIP ARTHROPLASTY Right 02/03/2013   Procedure: Right TOTAL HIP ARTHROPLASTY ANTERIOR APPROACH;  Surgeon: Mcarthur Rossetti, MD;  Location: [facility];  Service: Orthopedics;  Laterality: Right;  :   Current Outpatient Medications:    ALPRAZolam (XANAX) 1 MG tablet, Take by mouth., Disp: , Rfl:    Ascorbic Acid (VITAMIN C PO), Take by mouth. One daily (Patient not taking: Reported on 06/10/2021), Disp: , Rfl:    aspirin EC 325 MG EC tablet, Take 1 tablet (325 mg total) by mouth 2 (two) times daily after a meal. (Patient not taking: Reported on 06/10/2021), Disp: 30 tablet, Rfl: 0   HYDROcodone-acetaminophen (NORCO) 10-325 MG per tablet, Take 1 tablet by mouth every 6 (six) hours as needed for pain., Disp: , Rfl:    methocarbamol (ROBAXIN) 500 MG tablet, Take 1 tablet (500 mg total) by mouth 4 (four) times daily as needed. (Patient taking differently: Take 7.5 mg by mouth 4 (four) times daily as needed.), Disp: 60 tablet, Rfl: 0   olmesartan-hydrochlorothiazide (BENICAR HCT) 20-12.5 MG tablet, Take 1 tablet  by mouth daily., Disp: , Rfl:    oxyCODONE-acetaminophen (ROXICET) 5-325 MG per tablet, Take 1-2 tablets by mouth every 4 (four) hours as needed for pain. (Patient not taking: Reported on 06/10/2021), Disp: 60 tablet, Rfl: 0   PRESCRIPTION MEDICATION, Pt states he is taking a muscle relaxant pill every 6 hours - does not know name of medication (Patient not taking: Reported on 06/10/2021), Disp: , Rfl:    traMADol (ULTRAM) 50 MG tablet, Take 50 mg by mouth every 6 (six) hours as needed for pain. (Patient not taking: Reported on 06/10/2021), Disp: , Rfl: :   Allergies  Allergen Reactions   Other     NO  BLOOD OR BLOOD PRODUCTS-PT IS JEHOVAH'S WITNESS  :  No family history on file.:   Social History   Socioeconomic History   Marital status: Divorced    Spouse name: Not on file   Number of children: Not on file   Years of education: Not on file   Highest education level: Not on file  Occupational History   Not on file  Tobacco Use   Smoking status: Every Day    Packs/day: 1.50    Years: 40.00    Pack years: 60.00    Types: Cigarettes   Smokeless tobacco: Never  Substance and Sexual Activity   Alcohol use: Yes    Comment: 6 pack/week   Drug use: Yes    Types: Marijuana    Comment: marijuana, cocaine /    [place]   Sexual activity: Not on file  Other Topics Concern   Not on file  Social History Narrative   Not on file   Social Determinants of Health   Financial Resource Strain: Not on file  Food Insecurity: Not on file  Transportation Needs: Not on file  Physical Activity: Not on file  Stress: Not on file  Social Connections: Not on file  Intimate Partner Violence: Not on file  :  Pertinent items are noted in HPI.  Exam: ECOG 0 General appearance: alert and cooperative appeared without distress. Head: atraumatic without any abnormalities. Eyes: conjunctivae/corneas clear. PERRL.  Sclera anicteric. Throat: lips, mucosa, and tongue normal; without oral thrush or ulcers. Resp: clear to auscultation bilaterally without rhonchi, wheezes or dullness to percussion. Cardio: regular rate and rhythm, S1, S2 normal, no murmur, click, rub or gallop GI: soft, non-tender; bowel sounds normal; no masses,  no organomegaly Skin: Skin color, texture, turgor normal. No rashes or lesions Lymph nodes: Cervical, supraclavicular, and axillary nodes normal. Neurologic: Grossly normal without any motor, sensory or deep tendon reflexes. Musculoskeletal: No joint deformity or effusion.    NM Bone Scan Whole Body  Result Date: 05/16/2021 CLINICAL DATA:  History of prostate cancer  with intermittent bilateral hip pain several years. Bilateral hip replacements 2017. EXAM: NUCLEAR MEDICINE WHOLE BODY BONE SCAN TECHNIQUE: Whole body anterior and posterior images were obtained approximately 3 hours after intravenous injection of radiopharmaceutical. RADIOPHARMACEUTICALS:  18.6 mCi Technetium-63mMDP IV COMPARISON:  CT abdomen/pelvis 05/02/2021 FINDINGS: Evidence of patient's known bilateral hip prostheses. Minimal patchy symmetric uptake over the shoulders, knees and feet/ankles as well as right wrist likely degenerative in nature. Minimal symmetric uptake over the sternoclavicular joints likely degenerative. Subtle uptake over the mid to lower thoracic spine and lumbosacral region correlating to degenerative changes on patient's CT scan. There is sclerosis over the vertebral bodies of the thoracic lumbar spine on the recent CT centered mostly adjacent the endplates likely related patient's disc disease, although  metastatic disease is possible. Bone scan and is otherwise unremarkable. IMPRESSION: 1. 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 2.  Degenerative changes over the appendicular skeleton. Electronically Signed   By: Marin Olp M.D.   On: 05/16/2021 15:35    Assessment and Plan:    65 year old man with prostate cancer diagnosed in June 2022.  He presented with PSA of 11.1 that was repeated and found to be 8.96.  He underwent prostate biopsy which showed a Gleason score 3+4 = 7 in at least 11 cores.   This case was discussed in the prostate cancer multidisciplinary clinic including reviewing his imaging studies with the reviewing radiologist.  Pathology results were also reviewed with the reviewing pathologist and treatment choices were discussed.  Definitive therapy with radiation versus primary surgical therapy options were  discussed.  At this time he is favoring primary surgical approach he understands that he might require additional radiation therapy if he has positive margins  The role for systemic therapy was discussed at this time including the role of androgen deprivation with radiation as well as androgen receptor pathway inhibitors as well as systemic chemotherapy choices.  These options will be deferred unless he has advanced disease in the future.   He will consider all his options and make a decision in near future.  He is favoring primary surgical approach at this time.   All his questions were answered to his satisfaction.   30  minutes were dedicated to this visit. The time was spent on reviewing laboratory data, imaging studies, discussing treatment options, and answering questions regarding future plan.    A copy of this consult has been forwarded to the requesting physician.

## 2021-06-10 NOTE — Progress Notes (Signed)
Radiation Oncology         [phone] ________________________________  Multidisciplinary Prostate Cancer Clinic  Initial Radiation Oncology Consultation  Name: Vincent Herrera MRN: [id number]  Date: 06/10/2021  DOB: Nov 19, 1955  CC:Vincent Junior, MD  Vincent Seal, MD   REFERRING PHYSICIAN: Irine Seal, MD  DIAGNOSIS: 65 y.o. 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 Prostate cancer ([place])  C61       HISTORY OF PRESENT ILLNESS::Vincent Herrera is a 65 y.o. gentleman.  He was noted to have an elevated PSA of 11.1 by his primary care physician, Dr. Jimmye Herrera. Prior PSA in 09/2020 was 8.6. Accordingly, he was referred for evaluation in urology by Dr. Jeffie Herrera on 03/07/21,  digital rectal examination was performed at that time revealing bilateral lobe firmness, asymmetry with larger left lobe, and diffuse nodularity.  A repeat PSA that day was slightly decreased but remained elevated at 8.96. The patient proceeded to transrectal ultrasound with 12 biopsies of the prostate on 04/23/21.  The prostate volume measured 18 cc.  On ultrasound, there was a hypoechoic lesion in the peripheral zone, left mid apex, worrisome for T3 disease.  Out of 12 core biopsies, 11 were positive.  The maximum Gleason score was 3+4, and this was seen in all of the left-sided cores (all with perineural invasion), as well as the right mid, and right apex. Additionally, Gleason 3+3 was seen in the right base, right base lateral, and right mid lateral.  He underwent CT A/P on 05/02/2021 showing no evidence of metastatic disease. 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  The patient reviewed the biopsy results with his urologist and he has kindly been referred today to the multidisciplinary prostate cancer clinic for  presentation of pathology and radiology studies in our conference for discussion of potential radiation treatment options and clinical evaluation.  PREVIOUS RADIATION THERAPY: No  PAST MEDICAL HISTORY:  has a past medical history of Anginal pain ([place]), Arthritis, GERD (gastroesophageal reflux disease), History of substance abuse ([place]) (1991), No blood products, and Urinary frequency.    PAST SURGICAL HISTORY: Past Surgical History:  Procedure Laterality Date   NASAL RECONSTRUCTION     fractured nose   TOTAL HIP ARTHROPLASTY  10/07/2012   Procedure: TOTAL HIP ARTHROPLASTY ANTERIOR APPROACH;  Surgeon: Mcarthur Rossetti, MD;  Location: [facility];  Service: Orthopedics;  Laterality: Left;  Left Total Hip Arthroplasty, Anterior Approach (C-Arm)   TOTAL HIP ARTHROPLASTY Right 02/03/2013   Procedure: Right TOTAL HIP ARTHROPLASTY ANTERIOR APPROACH;  Surgeon: Mcarthur Rossetti, MD;  Location: [facility];  Service: Orthopedics;  Laterality: Right;    FAMILY HISTORY: family history is not on file.  SOCIAL HISTORY:  reports that he has been smoking cigarettes. He has a 60.00 pack-year smoking history. He has never used smokeless tobacco. He reports current alcohol use. He reports current drug use. Drug: Marijuana.  ALLERGIES: Other  MEDICATIONS:  Current Outpatient Medications  Medication Sig Dispense Refill   Ascorbic Acid (VITAMIN C PO) Take by mouth. One daily     aspirin EC 325 MG EC tablet Take 1 tablet (325 mg total) by mouth 2 (two) times daily after a meal. 30 tablet 0   HYDROcodone-acetaminophen (NORCO) 10-325 MG per tablet Take 1 tablet by mouth every 6 (six)  hours as needed for pain.     methocarbamol (ROBAXIN) 500 MG tablet Take 1 tablet (500 mg total) by mouth 4 (four) times daily as needed. 60 tablet 0   oxyCODONE-acetaminophen (ROXICET) 5-325 MG per tablet Take 1-2 tablets by mouth every 4 (four) hours as needed for pain. 60 tablet 0   PRESCRIPTION MEDICATION Pt states he is  taking a muscle relaxant pill every 6 hours - does not know name of medication     traMADol (ULTRAM) 50 MG tablet Take 50 mg by mouth every 6 (six) hours as needed for pain.     No current facility-administered medications for this encounter.    REVIEW OF SYSTEMS:  On review of systems, the patient reports that he is doing well overall. He denies any chest pain, shortness of breath, cough, fevers, chills, night sweats, unintended weight changes. He denies any bowel disturbances, and denies abdominal pain, nausea or vomiting. He denies any new musculoskeletal or joint aches or pains. His IPSS was 13, indicating moderate urinary symptoms. His SHIM was 10, indicating he has moderate erectile dysfunction. A complete review of systems is obtained and is otherwise negative.   PHYSICAL EXAM:  Wt Readings from Last 3 Encounters:  02/03/13 201 lb (91.2 kg)  01/26/13 201 lb 6.4 oz (91.4 kg)  10/07/12 183 lb (83 kg)   Temp Readings from Last 3 Encounters:  02/06/13 98.5 F (36.9 C) (Oral)  01/26/13 (!) 96.8 F (36 C) (Oral)  10/10/12 98.8 F (37.1 C) (Oral)   BP Readings from Last 3 Encounters:  02/06/13 133/84  01/26/13 126/86  10/10/12 109/69   Pulse Readings from Last 3 Encounters:  02/06/13 78  01/26/13 70  10/10/12 75    /10  In general this is a well appearing Caucasian male in no acute distress. He's alert and oriented x4 and appropriate throughout the examination. Cardiopulmonary assessment is negative for acute distress and he exhibits normal effort.  Prostate exam performed by Dr. Alinda Money does confirm extraprostatic extension.  KPS = 100  100 - Normal; no complaints; no evidence of disease. 90   - Able to carry on normal activity; minor signs or symptoms of disease. 80   - Normal activity with effort; some signs or symptoms of disease. 7   - Cares for self; unable to carry on normal activity or to do active work. 60   - Requires occasional assistance, but is able to care for  most of his personal needs. 50   - Requires considerable assistance and frequent medical care. 76   - Disabled; requires special care and assistance. 37   - Severely disabled; hospital admission is indicated although death not imminent. 68   - Very sick; hospital admission necessary; active supportive treatment necessary. 10   - Moribund; fatal processes progressing rapidly. 0     - Dead  Karnofsky DA, Abelmann [place], Craver LS and Burchenal [facility] [phone]) The use of the nitrogen mustards in the palliative treatment of carcinoma: with particular reference to bronchogenic carcinoma Cancer 1 634-56   LABORATORY DATA:  Lab Results  Component Value Date   WBC 10.8 (H) 02/06/2013   HGB 10.3 (L) 02/06/2013   HCT 30.0 (L) 02/06/2013   MCV 85.0 02/06/2013   PLT 209 02/06/2013   Lab Results  Component Value Date   NA 133 (L) 02/05/2013   K 3.4 (L) 02/05/2013   CL 98 02/05/2013   CO2 29 02/05/2013   No results found for: ALT, AST, GGT,  ALKPHOS, BILITOT   RADIOGRAPHY: NM Bone Scan Whole Body  Result Date: 05/16/2021 CLINICAL DATA:  History of prostate cancer with intermittent bilateral hip pain several years. Bilateral hip replacements 2017. EXAM: NUCLEAR MEDICINE WHOLE BODY BONE SCAN TECHNIQUE: Whole body anterior and posterior images were obtained approximately 3 hours after intravenous injection of radiopharmaceutical. RADIOPHARMACEUTICALS:  18.6 mCi Technetium-49mMDP IV COMPARISON:  CT abdomen/pelvis 05/02/2021 FINDINGS: Evidence of patient's known bilateral hip prostheses. Minimal patchy symmetric uptake over the shoulders, knees and feet/ankles as well as right wrist likely degenerative in nature. Minimal symmetric uptake over the sternoclavicular joints likely degenerative. Subtle uptake over the mid to lower thoracic spine and lumbosacral region correlating to degenerative changes on patient's CT scan. There is sclerosis over the vertebral bodies of the thoracic lumbar spine on the recent CT  centered mostly adjacent the endplates likely related patient's disc disease, although metastatic disease is possible. Bone scan and is otherwise unremarkable. IMPRESSION: 1. 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 2.  Degenerative changes over the appendicular skeleton. Electronically Signed   By: DMarin OlpM.D.   On: 05/16/2021 15:35      IMPRESSION/PLAN: 65y.o. gentleman with Stage T3a adenocarcinoma of the prostate with a Gleason score of 3+4 and a PSA of 8.96.    We discussed the patient's workup and outlined the nature of prostate cancer in this setting. The patient's T stage, Gleason's score, and PSA put him into the intermediate risk group but on exam, there is evidence of locally advanced disease. Accordingly, he is eligible for a variety of potential treatment options including  prostatectomy or LT-ADT concurrent with either 8 weeks of external radiation or a seed boost procedure followed by 5 weeks of daily external radiation. We discussed the available radiation techniques, and focused on the details and logistics of delivery. We discussed and outlined the risks, benefits, short and long-term effects associated with radiotherapy and compared and contrasted these with prostatectomy.   The patient focused most of his questions and interest in robotic-assisted laparoscopic radical prostatectomy.  We discussed some of the potential advantages of surgery including surgical staging, the availability of salvage radiotherapy to the prostatic fossa, and the confidence associated with immediate biochemical response. We discussed some of the potential proven indications for postoperative radiotherapy including positive margins, extracapsular extension, and seminal vesicle involvement. We also talked about some of the other potential findings leading  to a recommendation for radiotherapy including a non-zero postoperative PSA and positive lymph nodes.   At the end of the conversation the patient is interested in moving forward with prostatectomy.  We enjoyed meeting with him today, and will look forward to following his progress.  We would be more than happy to continue to participate in his care if clinically indicated in the future.     ANicholos Johns PA-C    MTyler Pita MD  [facility] Direct Dial: [phone] Fax: [phone]  Skype  LinkedIn   This document serves as a record of services personally performed by MTyler Pita MD and AFreeman Caldron PA-C. It was created on their behalf by KWilburn Mylar a trained medical scribe. The creation of this record is based on the scribe's personal observations and the provider's statements to them. This document has been checked and approved by the attending provider.

## 2021-06-13 ENCOUNTER — Encounter: Payer: Self-pay | Admitting: Medical Oncology

## 2021-06-13 ENCOUNTER — Encounter: Payer: Self-pay | Admitting: General Practice

## 2021-06-13 NOTE — Progress Notes (Signed)
[place] Spiritual Care Note  Referred by Jabier Gauss per patient request for follow-up support. Phoned Mr Caisse and let him know that I have already mailed him a brochure about an organization that helps resolve insurance issues as a resource for advocating for answers to his questions about copay/balance refund discrepancies. He reports that knowing this has reduced his anxiety and will help him have a nicer weekend. He knows to reach out as needed/desired for further support or assistance.   [place], [place], [facility] Pager [phone] Voicemail [phone]

## 2021-06-13 NOTE — Progress Notes (Signed)
Returned call to patient and left a  message. He asked if Lorrin Jackson ,could give him a call. I informed him, I sent Lattie Haw a message to give him a call.

## 2021-07-01 ENCOUNTER — Encounter: Payer: Self-pay | Admitting: General Practice

## 2021-07-01 ENCOUNTER — Telehealth: Payer: Self-pay | Admitting: *Deleted

## 2021-07-01 NOTE — Progress Notes (Signed)
[place] Spiritual Care Note  Referred by Carie Caddy for follow-up support, as Mr Dinkel did not receive the [facility] [place] flyer that I mailed to him. Phoned to ensure that he has the name and contact information of the organization for consultation about his health insurance questions. He was very appreciative and is aware of ongoing chaplain availability, should needs or questions arise.   [place], [place], [facility] Pager [phone] Voicemail [phone]

## 2021-07-01 NOTE — Telephone Encounter (Signed)
Patient called office to inquire if Dr. Alen Blew was doing his surgery. Said he had seen Dr. Valetta Mole and he thought it was him. Ascertained that patient saw Dr. Valetta Mole at [facility] and discussed surgery with him. Advised him to contact [facility] (confirmed that patient had number) to discuss. He verbalized understanding of information. He also said that he never received the packet of information Lorrin Jackson mailed to him about insurance. He is concerned that maybe "someone stole it from his mailbox". Patient was given her Voice mail#. Staff message also sent to Ms. Thamas Jaegers with his concerns.

## 2021-07-04 ENCOUNTER — Encounter: Payer: Self-pay | Admitting: Medical Oncology

## 2021-07-17 ENCOUNTER — Other Ambulatory Visit: Payer: Self-pay | Admitting: Urology

## 2021-08-07 NOTE — Progress Notes (Addendum)
PCP - Dr. York Ram  Cardiologist - no  PPM/ICD -  Device Orders -  Rep Notified -   Chest x-ray -  EKG -  Stress Test -  ECHO -  Cardiac Cath -   Sleep Study -  CPAP -   Fasting Blood Sugar -  Checks Blood Sugar _____ times a day  Blood Thinner Instructions: Aspirin Instructions:  ERAS Protcol - PRE-SURGERY   COVID TEST- 08-14-21 COVID vaccine Moderna + 2 boosters  Activity--Able to walk to mailbox without SOB  Able to do ADL's without SOB  Anesthesia review: HTN  Patient denies shortness of breath, fever, cough and chest pain at PAT appointment   All instructions explained to the patient, with a verbal understanding of the material. Patient agrees to go over the instructions while at home for a better understanding. Patient also instructed to self quarantine after being tested for COVID-19. The opportunity to ask questions was provided.

## 2021-08-07 NOTE — Patient Instructions (Addendum)
DUE TO COVID-19 ONLY ONE VISITOR IS ALLOWED TO COME WITH YOU AND STAY IN THE WAITING ROOM ONLY DURING PRE OP AND PROCEDURE DAY OF SURGERY.   TWO VISITOR  MAY VISIT WITH YOU AFTER SURGERY IN YOUR PRIVATE ROOM DURING VISITING HOURS ONLY!  YOU NEED TO HAVE A COVID 19 TEST ON__9-29-22_____Between 8am-3pm_______, THIS TEST MUST BE DONE BEFORE SURGERY,     Please bring completed form with you to the COVID testing site   COVID TESTING SITE [place] TEST IS COMPLETED,  PLEASE Wear a mask when in public           Your procedure is scheduled on: 08-18-21   Report to [facility] Main  Entrance   Report to admitting at      0900 AM     Call this number if you have problems the morning of surgery [phone]   Remember: miralax 17g in 4 oz of water at noon the day before surgery and one fleets enema the night before surgery   per MD order   Do not eat food :After Midnight. You may have clear liquids until 080 am then nothing by mouth    CLEAR LIQUID DIET                                                                    water Black Coffee and tea, regular and decaf                             Plain Jell-O any favor except red or purple                                  Fruit ices (not with fruit pulp)                                      Iced Popsicles                                     Carbonated beverages, regular and diet                                    Cranberry, grape and apple juices Sports drinks like Gatorade Lightly seasoned clear broth or consume(fat free) Sugar, honey syrup _____________________________________________________________________     BRUSH YOUR TEETH MORNING OF SURGERY AND RINSE YOUR MOUTH OUT, NO CHEWING GUM CANDY OR MINTS.     Take these medicines the morning of surgery with A SIP OF WATER: Xanax if needed, methocarbamol, oxycodone if needed                                 You may  not have any metal on your body including hair pins and  piercings  Do not wear jewelry, lotions, powders,perfumes,  or deodorant                       Men may shave face and neck.   Do not bring valuables to the hospital. [place].  Contacts, dentures or bridgework may not be worn into surgery.       Patients discharged the day of surgery will not be allowed to drive home. IF YOU ARE HAVING SURGERY AND GOING HOME THE SAME DAY, YOU MUST HAVE AN ADULT TO DRIVE YOU HOME AND BE WITH YOU FOR 24 HOURS. YOU MAY GO HOME BY TAXI OR UBER OR ORTHERWISE, BUT AN ADULT MUST ACCOMPANY YOU HOME AND STAY WITH YOU FOR 24 HOURS.  Name and phone number of your driver:  Special Instructions: N/A              Please read over the following fact sheets you were given: _____________________________________________________________________             [facility] - Preparing for Surgery Before surgery, you can play an important role.  Because skin is not sterile, your skin needs to be as free of germs as possible.  You can reduce the number of germs on your skin by washing with CHG (chlorahexidine gluconate) soap before surgery.  CHG is an antiseptic cleaner which kills germs and bonds with the skin to continue killing germs even after washing. Please DO NOT use if you have an allergy to CHG or antibacterial soaps.  If your skin becomes reddened/irritated stop using the CHG and inform your nurse when you arrive at Short Stay. Do not shave (including legs and underarms) for at least 48 hours prior to the first CHG shower.  You may shave your face/neck. Please follow these instructions carefully:  1.  Shower with CHG Soap the night before surgery and the  morning of Surgery.  2.  If you choose to wash your hair, wash your hair first as usual with your  normal  shampoo.  3.  After you shampoo, rinse your hair and body thoroughly to remove the  shampoo.                            4.  Use CHG as you would any other liquid soap.  You can apply chg directly  to the skin and wash                       Gently with a scrungie or clean washcloth.  5.  Apply the CHG Soap to your body ONLY FROM THE NECK DOWN.   Do not use on face/ open                           Wound or open sores. Avoid contact with eyes, ears mouth and genitals (private parts).                       Wash face,  Genitals (private parts) with your normal soap.             6.  Wash thoroughly, paying special attention to the area where your surgery  will be performed.  7.  Thoroughly rinse your body with  warm water from the neck down.  8.  DO NOT shower/wash with your normal soap after using and rinsing off  the CHG Soap.                9.  Pat yourself dry with a clean towel.            10.  Wear clean pajamas.            11.  Place clean sheets on your bed the night of your first shower and do not  sleep with pets. Day of Surgery : Do not apply any lotions/deodorants the morning of surgery.  Please wear clean clothes to the hospital/surgery center.  FAILURE TO FOLLOW THESE INSTRUCTIONS MAY RESULT IN THE CANCELLATION OF YOUR SURGERY PATIENT SIGNATURE_________________________________  NURSE SIGNATURE__________________________________  ________________________________________________________________________    Vincent Herrera  An incentive spirometer is a tool that can help keep your lungs clear and active. This tool measures how well you are filling your lungs with each breath. Taking long deep breaths may help reverse or decrease the chance of developing breathing (pulmonary) problems (especially infection) following: A long period of time when you are unable to move or be active. BEFORE THE PROCEDURE  If the spirometer includes an indicator to show your best effort, your nurse or respiratory therapist will set it to a desired goal. If possible, sit up straight or lean slightly  forward. Try not to slouch. Hold the incentive spirometer in an upright position. INSTRUCTIONS FOR USE  Sit on the edge of your bed if possible, or sit up as far as you can in bed or on a chair. Hold the incentive spirometer in an upright position. Breathe out normally. Place the mouthpiece in your mouth and seal your lips tightly around it. Breathe in slowly and as deeply as possible, raising the piston or the ball toward the top of the column. Hold your breath for 3-5 seconds or for as long as possible. Allow the piston or ball to fall to the bottom of the column. Remove the mouthpiece from your mouth and breathe out normally. Rest for a few seconds and repeat Steps 1 through 7 at least 10 times every 1-2 hours when you are awake. Take your time and take a few normal breaths between deep breaths. The spirometer may include an indicator to show your best effort. Use the indicator as a goal to work toward during each repetition. After each set of 10 deep breaths, practice coughing to be sure your lungs are clear. If you have an incision (the cut made at the time of surgery), support your incision when coughing by placing a pillow or rolled up towels firmly against it. Once you are able to get out of bed, walk around indoors and cough well. You may stop using the incentive spirometer when instructed by your caregiver.  RISKS AND COMPLICATIONS Take your time so you do not get dizzy or light-headed. If you are in pain, you may need to take or ask for pain medication before doing incentive spirometry. It is harder to take a deep breath if you are having pain. AFTER USE Rest and breathe slowly and easily. It can be helpful to keep track of a log of your progress. Your caregiver can provide you with a simple table to help with this. If you are using the spirometer at home, follow these instructions: [place] IF:  You are having difficultly using the spirometer. You have trouble using the  spirometer as often as instructed. Your pain medication is not giving enough relief while using the spirometer. You develop fever of 100.5 F (38.1 C) or higher. SEEK IMMEDIATE MEDICAL CARE IF:  You cough up bloody sputum that had not been present before. You develop fever of 102 F (38.9 C) or greater. You develop worsening pain at or near the incision site. MAKE SURE YOU:  Understand these instructions. Will watch your condition. Will get help right away if you are not doing well or get worse. Document Released: 03/15/2007 Document Revised: 01/25/2012 Document Reviewed: 05/16/2007 [facility] Patient Information 2014 [place], [place].   ________________________________________________________________________

## 2021-08-08 ENCOUNTER — Other Ambulatory Visit: Payer: Self-pay

## 2021-08-08 ENCOUNTER — Encounter (HOSPITAL_COMMUNITY)
Admission: RE | Admit: 2021-08-08 | Discharge: 2021-08-08 | Disposition: A | Discharge status: Home / Self Care | Payer: Commercial Managed Care - PPO | Source: Ambulatory Visit | Attending: Urology | Admitting: Urology

## 2021-08-08 ENCOUNTER — Encounter (HOSPITAL_COMMUNITY): Payer: Self-pay

## 2021-08-08 DIAGNOSIS — Z01818 Encounter for other preprocedural examination: Secondary | ICD-10-CM | POA: Insufficient documentation

## 2021-08-08 HISTORY — DX: Anxiety disorder, unspecified: F41.9

## 2021-08-08 HISTORY — DX: Chronic obstructive pulmonary disease, unspecified: J44.9

## 2021-08-08 HISTORY — DX: Malignant (primary) neoplasm, unspecified: C80.1

## 2021-08-08 HISTORY — DX: Headache, unspecified: R51.9

## 2021-08-08 HISTORY — DX: Pneumonia, unspecified organism: J18.9

## 2021-08-08 HISTORY — DX: Essential (primary) hypertension: I10

## 2021-08-08 LAB — CBC
HCT: 40.8 % (ref 39.0–52.0)
Hemoglobin: 13.1 g/dL (ref 13.0–17.0)
MCH: 28.2 pg (ref 26.0–34.0)
MCHC: 32.1 g/dL (ref 30.0–36.0)
MCV: 87.9 fL (ref 80.0–100.0)
Platelets: 414 10*3/uL — ABNORMAL HIGH (ref 150–400)
RBC: 4.64 MIL/uL (ref 4.22–5.81)
RDW: 13.2 % (ref 11.5–15.5)
WBC: 7.1 10*3/uL (ref 4.0–10.5)
nRBC: 0 % (ref 0.0–0.2)

## 2021-08-08 LAB — BASIC METABOLIC PANEL
Anion gap: 7 (ref 5–15)
BUN: 15 mg/dL (ref 8–23)
CO2: 28 mmol/L (ref 22–32)
Calcium: 8.8 mg/dL — ABNORMAL LOW (ref 8.9–10.3)
Chloride: 98 mmol/L (ref 98–111)
Creatinine, Ser: 1.1 mg/dL (ref 0.61–1.24)
GFR, Estimated: >60 mL/min (ref >60)
Glucose, Bld: 104 mg/dL — ABNORMAL HIGH (ref 70–99)
Potassium: 3.8 mmol/L (ref 3.5–5.1)
Sodium: 133 mmol/L — ABNORMAL LOW (ref 135–145)

## 2021-08-08 LAB — NO BLOOD PRODUCTS

## 2021-08-10 NOTE — Progress Notes (Signed)
Spoke with patient regarding treatment of prostate cancer. He has decided on surgery. I informed him, I will notify Dr. Alinda Money and Dr. Tammi Klippel, and Dr. Lynne Logan office will call him with surgery date. He voiced understanding.

## 2021-08-14 ENCOUNTER — Other Ambulatory Visit: Payer: Self-pay | Admitting: Urology

## 2021-08-15 LAB — SARS CORONAVIRUS 2 (TAT 6-24 HRS): SARS Coronavirus 2: NEGATIVE

## 2021-08-15 NOTE — H&P (Signed)
Office Visit Report     08/14/2021   --------------------------------------------------------------------------------   Vincent Herrera  MRN: [id number]  DOB: 02-24-1956, 65 year old Male  SSN:    PRIMARY CARE:  Vincent Mems. Jimmye Norman, MD  REFERRING:  Vincent Herrera, Vincent Herrera  PROVIDER:  Irine Herrera, M.D.  TREATING:  Vincent Crocker, NP  LOCATION:  [facility] [phone]     --------------------------------------------------------------------------------   CC/HPI: CC: Prostate Cancer   Physician requesting consult: Vincent. Irine Herrera  PCP: Vincent. York Herrera  Location of consult: [facility] - [facility]   Vincent Herrera is a 65 year old gentleman with a medical history significant for anxiety, hypertension, arthritis, and chronic pain (oxycodone) who was found to have an elevated PSA of 11.1 prompting a TRUS biopsy of the prostate on 04/23/21. His biopsy indicated Gleason 3+4=7 adenocarcinoma with 11 out of 12 biopsy cores and there was concern for locally advanced disease on ultrasound with capsular bulging on the left. He underwent staging imaging with a bone scan (05/15/21) with minimal uptake of the mid/lower thoracic and lumbar spine likely degenerative as well as a CT scan of the abdomen and pelvis (05/02/21) that demonstrated no lymphadenopathy.   Family history: None.   Imaging studies:  Bone scan (05/15/21): See above.  CT abd/pelvis (05/02/21): See above.   PMH: He has a history of anxiety, hypertension, arthritis, and chronic pain (on oxycodone prn).  PSH: No abdominal surgeries.   TNM stage: cT3a N0 M0  PSA: 11.1  Gleason score: 3+4=7 (GG 2)  Biopsy (04/23/21): 11/12 cores positive  Left: L lateral apex (80%, 3+4=7, PNI), L apex (50%, 3+4=7, PNI), L lateral mid (90%, 3+4=7, PNI), L mid (60%, 3+4=7, PNI), L lateral base (60%, 3+4=7, PNI), L base (60%, 3+4=7, PNI)  Right: R apex (40%, 3+4=7), R mid (90%, 3+4=7), R lateral mid  (30%, 3+3=6), R base (10%, 3+3=6), R lateral base (10%, 3+3=6)  Prostate volume: 18 cc   Nomogram  OC disease: 10%  EPE: 90%  SVI: 27%  LNI: 22%  PFS (5 year, 10 year): 47%, 32%   Urinary function: IPSS is 13.  Erectile function: SHIM score is 10.   08/14/2021: Pt to undergo robotic prostatectomy with Vincent Herrera on 10/03. Here today for preoperative appointment. He denies changes in past medical history, prescription medications taken on a daily basis, denies any interval surgical or procedural interventions. He denies any new or worsening lower urinary tract symptoms specifically absence of burning or painful urination, visible blood in the urine. He has had no interval fevers or chills, nausea/vomiting. Denies interval treatment for any type of infection. He denies chest pain, shortness of breath, palpitations, lightheadedness or dizziness. He did inform the Herrera he then contacted [place] regarding this decision but patient has declined to receive any type of blood products should that be required citing with the just reasons as the primary factor.     ALLERGIES: None   MEDICATIONS: Aspirin PRN  Alprazolam 1 mg tablet  Ibuprofen PRN  Levofloxacin 750 mg tablet 1 po 1 hour prior to the procedure  Methocarbamol 750 mg tablet  Olmesartan-Hydrochlorothiazide  Oxycodone-Acetaminophen 10 mg-325 mg tablet     GU PSH: Locm 300-399Mg /Ml Iodine,1Ml - 05/02/2021 Prostate Needle Biopsy - 04/23/2021       PSH Notes: Nasal reconstruction surgery   NON-GU PSH: Hip Replacement Surgical Pathology, Gross And Microscopic Examination For Prostate Needle - 04/23/2021     GU PMH:  Prostate Cancer - 08/08/2021, - 06/10/2021, T2c/3 N0 M? 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 I discussed treatment options with him and explained that he is a poor candidate for active surveillance. I discussed RALP and reviewed the risks in detail and explained that he would have a very  high risk of requiring multimodal therapy as the probability of locally advanced disease is high. I reviewed the risks of radiation therapy with him. I believe her would need EXRT w/wo a seed boost and adjuvant ADT for about 6-8 months. I reviewed the side effects of ADT and the risks and benefits of radiation therapy in detail. At this time I will plan to have him seen in the Vincent Herrera to further discuss treatment options but will try to make sure that is scheduled so that the bone scan will be available. He is a surgical candidate based on current evidence but I believe he might be better served with the radiation and ADT with the probably locally advanced nature of his disease. I also discussed the need for Gold seeds and SpaceOAR and reviewed the added risks with that procedure. , - 05/02/2021 Weak Urinary Stream - 08/08/2021, - 03/07/2021 Elevated PSA, He has a large hypoechoic lesion of the left PZ in a small prostate and has a high probabilty of a T2c/T3 prostate cancer. I will notify him of the results and obtain staging as indicated. - 04/23/2021, He has an elevated PSA with a nodular prostate that would be consistent with a T2c lesion if he has cancer. He needs a prostate [place] and biopsy and I have reviewed the risks of bleeding, infection and voiding difficulty. Levaquin sent. I will also repeat a PSA today and a testosterone level since he is on chronic opioids. , - 03/07/2021 Prostate nodule w/ LUTS - 04/23/2021, - 03/07/2021      PMH Notes:   1) Prostate cancer: He is s/p a NNS RAL radical prostatectomy and BPLND on 08/18/21.   Diagnosis:  Pretreatment PSA: 11.1  Pretreatment SHIM score: 10   NON-GU PMH: Anxiety Arthritis Chronic pain syndrome Hypertension    FAMILY HISTORY: Death of family member - Father, Mother   SOCIAL HISTORY: Marital Status: Single Preferred Language: English; Ethnicity: Not Hispanic Or Latino; Race: White Current Smoking Status: Patient does not smoke anymore. Has not  smoked since 02/15/2019. Smoked for 40 years.   Tobacco Use Assessment Completed: Used Tobacco in last 30 days? Does not use smokeless tobacco. Light Drinker.  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    REVIEW OF SYSTEMS:    GU Review Male:   Patient denies frequent urination, hard to postpone urination, burning/ pain with urination, get up at night to urinate, leakage of urine, stream starts and stops, trouble starting your stream, have to strain to urinate , erection problems, and penile pain.  Gastrointestinal (Upper):   Patient denies nausea, vomiting, and indigestion/ heartburn.  Gastrointestinal (Lower):   Patient denies diarrhea and constipation.  Constitutional:   Patient denies fever, night sweats, weight loss, and fatigue.  Skin:   Patient denies skin rash/ lesion and itching.  Eyes:   Patient denies blurred vision and double vision.  Ears/ Nose/ Throat:   Patient denies sore throat and sinus problems.  Hematologic/Lymphatic:   Patient denies swollen glands and easy bruising.  Cardiovascular:   Patient denies chest pains and leg swelling.  Respiratory:   Patient denies cough and shortness of breath.  Endocrine:   Patient  denies excessive thirst.  Musculoskeletal:   Patient denies back pain and joint pain.  Neurological:   Patient denies headaches and dizziness.  Psychologic:   Patient denies depression and anxiety.   VITAL SIGNS:      08/14/2021 02:31 PM  Weight 221.4 lb / 100.43 kg  Height 67 in / 170.18 cm  BP 127/77 mmHg  Pulse 72 /min  Temperature 97.7 F / 36.5 C  BMI 34.7 kg/m   MULTI-SYSTEM PHYSICAL EXAMINATION:    Constitutional: Well-nourished. No physical deformities. Normally developed. Good grooming.  Neck: Neck symmetrical, not swollen. Normal tracheal position.  Respiratory: Normal breath sounds. No labored breathing, no use of accessory muscles.   Cardiovascular: Regular rate and rhythm. No murmur, no gallop. Normal temperature, normal extremity pulses,  no swelling, no varicosities.   Skin: No paleness, no jaundice, no cyanosis. No lesion, no ulcer, no rash.  Neurologic / Psychiatric: Oriented to time, oriented to place, oriented to person. No depression, no anxiety, no agitation.  Gastrointestinal: Obese abdomen. No mass, no tenderness, no rigidity.   Musculoskeletal: Normal gait and station of head and neck.     Complexity of Data:  Source Of History:  Patient, Medical Record Summary  Lab Test Review:   PSA  Records Review:   Pathology Reports, Previous Doctor Records, Previous Herrera Records, Previous Patient Records  Urine Test Review:   Urinalysis  X-Ray Review: C.T. Abdomen/Pelvis: Reviewed Films. Reviewed Report.  Bone Scan: Reviewed Report.     03/07/21  PSA  Total PSA 8.96 ng/mL    PROCEDURES:          Urinalysis Dipstick Dipstick Cont'd  Color: Yellow Bilirubin: Neg mg/dL  Appearance: Clear Ketones: Neg mg/dL  Specific Gravity: 1.025 Blood: Neg ery/uL  pH: <=5.0 Protein: Neg mg/dL  Glucose: Neg mg/dL Urobilinogen: 0.2 mg/dL    Nitrites: Neg    Leukocyte Esterase: Neg leu/uL    ASSESSMENT:      ICD-10 Details  1 GU:   Prostate Cancer - C61 Chronic, Threat to Bodily Function  2 NON-GU:   Encounter for other preprocedural examination - Z01.818 Undiagnosed New Problem   PLAN:           Orders Labs Urine Culture          Schedule Return Visit/Planned Activity: Keep Scheduled Appointment - Follow up MD, Schedule Surgery          Document Letter(s):  Created for Patient: Clinical Summary         Notes:   I confirmed with the patient that he indeed does not wish to have blood products administered peri or postoperatively. Although exceedingly low there is a risk for bleeding from unforeseen injury encountered during the procedure or postoperatively that may require additional surgical intervention especially with following his wishes not to receive any type of blood products. Again risk is low but communicated  in detail with understanding expressed by the patient. His urologist is aware as well.   All questions answered to the best my ability regarding upcoming procedure and expected postoperative course with understanding expressed by the patient. Baseline urine culture sent today. He will proceed with previously scheduled robotic prostatectomy on 10/03 with his urologist.        Next Appointment:      Next Appointment: 08/18/2021 11:30 AM    Appointment Type: Surgery     Location: [facility] [phone]    Provider: Raynelle Herrera, M.D.    Reason for Visit: WL/EXT  REC RA LAP RAD PROSTATECTOMY LEVE 2, BPLA WITH AMANDA      * Signed by Vincent Crocker, NP on 08/14/21 at 2:45 PM (EDT*

## 2021-08-18 ENCOUNTER — Ambulatory Visit (HOSPITAL_COMMUNITY): Payer: Commercial Managed Care - PPO | Admitting: Registered Nurse

## 2021-08-18 ENCOUNTER — Encounter (HOSPITAL_COMMUNITY)
Admission: RE | Disposition: A | Discharge status: Home / Self Care | Payer: Self-pay | Source: Home / Self Care | Attending: Urology

## 2021-08-18 ENCOUNTER — Observation Stay (HOSPITAL_COMMUNITY)
Admission: RE | Admit: 2021-08-18 | Discharge: 2021-08-19 | Disposition: A | Discharge status: Home / Self Care | Payer: Commercial Managed Care - PPO | Attending: Urology | Admitting: Urology

## 2021-08-18 DIAGNOSIS — I10 Essential (primary) hypertension: Secondary | ICD-10-CM | POA: Insufficient documentation

## 2021-08-18 DIAGNOSIS — Z79899 Other long term (current) drug therapy: Secondary | ICD-10-CM | POA: Diagnosis not present

## 2021-08-18 DIAGNOSIS — C61 Malignant neoplasm of prostate: Secondary | ICD-10-CM | POA: Diagnosis present

## 2021-08-18 DIAGNOSIS — Z7982 Long term (current) use of aspirin: Secondary | ICD-10-CM | POA: Insufficient documentation

## 2021-08-18 HISTORY — PX: ROBOT ASSISTED LAPAROSCOPIC RADICAL PROSTATECTOMY: SHX5141

## 2021-08-18 HISTORY — PX: LYMPHADENECTOMY: SHX5960

## 2021-08-18 LAB — HEMOGLOBIN AND HEMATOCRIT, BLOOD
HCT: 40.1 % (ref 39.0–52.0)
Hemoglobin: 13.2 g/dL (ref 13.0–17.0)

## 2021-08-18 SURGERY — XI ROBOTIC ASSISTED LAPAROSCOPIC RADICAL PROSTATECTOMY LEVEL 2
Anesthesia: General

## 2021-08-18 MED ORDER — SODIUM CHLORIDE 0.9 % IV BOLUS: 1000.0000 mL | Freq: Once | INTRAVENOUS | Status: DC

## 2021-08-18 MED ORDER — SUFENTANIL CITRATE 50 MCG/ML IV SOLN
INTRAVENOUS | Status: AC
  Filled 2021-08-18: qty 1

## 2021-08-18 MED ORDER — DEXMEDETOMIDINE (PRECEDEX) IN NS 20 MCG/5ML (4 MCG/ML) IV SYRINGE
PREFILLED_SYRINGE | INTRAVENOUS | Status: DC | PRN
  Administered 2021-08-18: 8 ug via INTRAVENOUS
  Administered 2021-08-18: 4 ug via INTRAVENOUS

## 2021-08-18 MED ORDER — POLYETHYLENE GLYCOL 3350 17 G PO PACK: 17.0000 g | PACK | Freq: Every day | ORAL | Status: DC

## 2021-08-18 MED ORDER — FLEET ENEMA 7-19 GM/118ML RE ENEM: 1.0000 | ENEMA | Freq: Once | RECTAL | Status: DC

## 2021-08-18 MED ORDER — SUGAMMADEX SODIUM 500 MG/5ML IV SOLN
INTRAVENOUS | Status: DC | PRN
  Administered 2021-08-18: 300 mg via INTRAVENOUS

## 2021-08-18 MED ORDER — ONDANSETRON HCL 4 MG/2ML IJ SOLN
INTRAMUSCULAR | Status: AC
  Filled 2021-08-18: qty 2

## 2021-08-18 MED ORDER — PROPOFOL 10 MG/ML IV BOLUS
INTRAVENOUS | Status: DC | PRN
  Administered 2021-08-18: 10 mg via INTRAVENOUS
  Administered 2021-08-18: 40 mg via INTRAVENOUS
  Administered 2021-08-18: 160 mg via INTRAVENOUS

## 2021-08-18 MED ORDER — DEXAMETHASONE SODIUM PHOSPHATE 10 MG/ML IJ SOLN
INTRAMUSCULAR | Status: DC | PRN
  Administered 2021-08-18: 10 mg via INTRAVENOUS

## 2021-08-18 MED ORDER — ONDANSETRON HCL 4 MG/2ML IJ SOLN: 4.0000 mg | INTRAMUSCULAR | Status: DC | PRN

## 2021-08-18 MED ORDER — KETOROLAC TROMETHAMINE 15 MG/ML IJ SOLN
15.0000 mg | Freq: Four times a day (QID) | INTRAMUSCULAR | Status: DC
  Administered 2021-08-18 – 2021-08-19 (×3): 15 mg via INTRAVENOUS
  Filled 2021-08-18 (×3): qty 1

## 2021-08-18 MED ORDER — SUCCINYLCHOLINE CHLORIDE 200 MG/10ML IV SOSY
PREFILLED_SYRINGE | INTRAVENOUS | Status: DC | PRN
  Administered 2021-08-18: 180 mg via INTRAVENOUS

## 2021-08-18 MED ORDER — PROPOFOL 10 MG/ML IV BOLUS
INTRAVENOUS | Status: AC
  Filled 2021-08-18: qty 20

## 2021-08-18 MED ORDER — KCL IN DEXTROSE-NACL 20-5-0.45 MEQ/L-%-% IV SOLN
INTRAVENOUS | Status: DC
  Filled 2021-08-18 (×2): qty 1000

## 2021-08-18 MED ORDER — STERILE WATER FOR IRRIGATION IR SOLN
Status: DC | PRN
  Administered 2021-08-18: 1000 mL

## 2021-08-18 MED ORDER — ONDANSETRON HCL 4 MG/2ML IJ SOLN
INTRAMUSCULAR | Status: DC | PRN
  Administered 2021-08-18: 4 mg via INTRAVENOUS

## 2021-08-18 MED ORDER — BELLADONNA ALKALOIDS-OPIUM 16.2-60 MG RE SUPP
RECTAL | Status: AC
  Administered 2021-08-18: 1 via RECTAL
  Filled 2021-08-18: qty 1

## 2021-08-18 MED ORDER — SULFAMETHOXAZOLE-TRIMETHOPRIM 800-160 MG PO TABS: 1.0000 | ORAL_TABLET | Freq: Two times a day (BID) | ORAL | 0 refills | Status: AC

## 2021-08-18 MED ORDER — LIDOCAINE 2% (20 MG/ML) 5 ML SYRINGE
INTRAMUSCULAR | Status: DC | PRN
  Administered 2021-08-18: 5 mg via INTRAVENOUS

## 2021-08-18 MED ORDER — DEXMEDETOMIDINE (PRECEDEX) IN NS 20 MCG/5ML (4 MCG/ML) IV SYRINGE
PREFILLED_SYRINGE | INTRAVENOUS | Status: AC
  Filled 2021-08-18: qty 5

## 2021-08-18 MED ORDER — DOCUSATE SODIUM 100 MG PO CAPS: 100.0000 mg | ORAL_CAPSULE | Freq: Two times a day (BID) | ORAL | Status: AC

## 2021-08-18 MED ORDER — ZOLPIDEM TARTRATE 5 MG PO TABS: 5.0000 mg | ORAL_TABLET | Freq: Every evening | ORAL | Status: DC | PRN

## 2021-08-18 MED ORDER — ROCURONIUM BROMIDE 10 MG/ML (PF) SYRINGE
PREFILLED_SYRINGE | INTRAVENOUS | Status: DC | PRN
  Administered 2021-08-18: 100 mg via INTRAVENOUS

## 2021-08-18 MED ORDER — CEFAZOLIN SODIUM-DEXTROSE 1-4 GM/50ML-% IV SOLN
1.0000 g | Freq: Three times a day (TID) | INTRAVENOUS | Status: AC
Start: 2021-08-18 — End: 2021-08-19
  Administered 2021-08-18 – 2021-08-19 (×2): 1 g via INTRAVENOUS
  Filled 2021-08-18 (×2): qty 50

## 2021-08-18 MED ORDER — LACTATED RINGERS IV SOLN: INTRAVENOUS | Status: DC | PRN

## 2021-08-18 MED ORDER — BELLADONNA ALKALOIDS-OPIUM 16.2-60 MG RE SUPP: 1.0000 | Freq: Four times a day (QID) | RECTAL | Status: DC | PRN

## 2021-08-18 MED ORDER — METHOCARBAMOL 500 MG PO TABS
750.0000 mg | ORAL_TABLET | Freq: Two times a day (BID) | ORAL | Status: DC
  Administered 2021-08-18 – 2021-08-19 (×2): 750 mg via ORAL
  Filled 2021-08-18 (×2): qty 2

## 2021-08-18 MED ORDER — DIPHENHYDRAMINE HCL 50 MG/ML IJ SOLN: 12.5000 mg | Freq: Four times a day (QID) | INTRAMUSCULAR | Status: DC | PRN

## 2021-08-18 MED ORDER — HYDROMORPHONE HCL 1 MG/ML IJ SOLN
INTRAMUSCULAR | Status: AC
  Administered 2021-08-18: 0.25 mg via INTRAVENOUS
  Filled 2021-08-18: qty 1

## 2021-08-18 MED ORDER — MORPHINE SULFATE (PF) 4 MG/ML IV SOLN: 2.0000 mg | INTRAVENOUS | Status: DC | PRN

## 2021-08-18 MED ORDER — BUPIVACAINE-EPINEPHRINE 0.25% -1:200000 IJ SOLN
INTRAMUSCULAR | Status: DC | PRN
  Administered 2021-08-18: 30 mL

## 2021-08-18 MED ORDER — ALPRAZOLAM 1 MG PO TABS
1.0000 mg | ORAL_TABLET | Freq: Three times a day (TID) | ORAL | Status: DC | PRN
  Administered 2021-08-18: 1 mg via ORAL
  Filled 2021-08-18: qty 1

## 2021-08-18 MED ORDER — BUPIVACAINE-EPINEPHRINE (PF) 0.25% -1:200000 IJ SOLN
INTRAMUSCULAR | Status: AC
  Filled 2021-08-18: qty 30

## 2021-08-18 MED ORDER — CHLORHEXIDINE GLUCONATE 0.12 % MT SOLN
15.0000 mL | Freq: Once | OROMUCOSAL | Status: AC
Start: 2021-08-18 — End: 2021-08-18
  Administered 2021-08-18: 15 mL via OROMUCOSAL

## 2021-08-18 MED ORDER — SUFENTANIL CITRATE 50 MCG/ML IV SOLN
INTRAVENOUS | Status: DC | PRN
  Administered 2021-08-18 (×2): 5 ug via INTRAVENOUS
  Administered 2021-08-18: 15 ug via INTRAVENOUS
  Administered 2021-08-18 (×5): 10 ug via INTRAVENOUS

## 2021-08-18 MED ORDER — HYDROMORPHONE HCL 1 MG/ML IJ SOLN
INTRAMUSCULAR | Status: DC | PRN
  Administered 2021-08-18 (×2): .5 mg via INTRAVENOUS
  Administered 2021-08-18: 1 mg via INTRAVENOUS

## 2021-08-18 MED ORDER — HYDROMORPHONE HCL 2 MG/ML IJ SOLN
INTRAMUSCULAR | Status: AC
  Filled 2021-08-18: qty 1

## 2021-08-18 MED ORDER — HYDROMORPHONE HCL 1 MG/ML IJ SOLN
0.2500 mg | INTRAMUSCULAR | Status: AC | PRN
  Administered 2021-08-18 (×6): 0.25 mg via INTRAVENOUS

## 2021-08-18 MED ORDER — DIPHENHYDRAMINE HCL 12.5 MG/5ML PO ELIX: 12.5000 mg | ORAL_SOLUTION | Freq: Four times a day (QID) | ORAL | Status: DC | PRN

## 2021-08-18 MED ORDER — ORAL CARE MOUTH RINSE: 15.0000 mL | Freq: Once | OROMUCOSAL | Status: AC

## 2021-08-18 MED ORDER — DOCUSATE SODIUM 100 MG PO CAPS
100.0000 mg | ORAL_CAPSULE | Freq: Two times a day (BID) | ORAL | Status: DC
  Administered 2021-08-18 – 2021-08-19 (×2): 100 mg via ORAL
  Filled 2021-08-18 (×2): qty 1

## 2021-08-18 MED ORDER — ACETAMINOPHEN 10 MG/ML IV SOLN: 1000.0000 mg | Freq: Once | INTRAVENOUS | Status: DC | PRN

## 2021-08-18 MED ORDER — LABETALOL HCL 5 MG/ML IV SOLN
INTRAVENOUS | Status: DC | PRN
  Administered 2021-08-18: 5 mg via INTRAVENOUS

## 2021-08-18 MED ORDER — BACITRACIN-NEOMYCIN-POLYMYXIN 400-5-5000 EX OINT: 1.0000 "application " | TOPICAL_OINTMENT | Freq: Three times a day (TID) | CUTANEOUS | Status: DC | PRN

## 2021-08-18 MED ORDER — OXYCODONE-ACETAMINOPHEN 10-325 MG PO TABS: 1.0000 | ORAL_TABLET | Freq: Four times a day (QID) | ORAL | 0 refills | Status: AC | PRN

## 2021-08-18 MED ORDER — HEPARIN SODIUM (PORCINE) 1000 UNIT/ML IJ SOLN
INTRAMUSCULAR | Status: AC
  Filled 2021-08-18: qty 1

## 2021-08-18 MED ORDER — CEFAZOLIN SODIUM-DEXTROSE 2-4 GM/100ML-% IV SOLN
2.0000 g | Freq: Once | INTRAVENOUS | Status: AC
  Administered 2021-08-18: 2 g via INTRAVENOUS
  Filled 2021-08-18: qty 100

## 2021-08-18 MED ORDER — ONDANSETRON HCL 4 MG/2ML IJ SOLN: 4.0000 mg | Freq: Once | INTRAMUSCULAR | Status: DC | PRN

## 2021-08-18 MED ORDER — MIDAZOLAM HCL 5 MG/5ML IJ SOLN
INTRAMUSCULAR | Status: DC | PRN
  Administered 2021-08-18: 2 mg via INTRAVENOUS

## 2021-08-18 MED ORDER — MIDAZOLAM HCL 2 MG/2ML IJ SOLN
INTRAMUSCULAR | Status: AC
  Filled 2021-08-18: qty 2

## 2021-08-18 MED ORDER — LACTATED RINGERS IV SOLN: INTRAVENOUS | Status: DC

## 2021-08-18 MED ORDER — SODIUM CHLORIDE 0.9 % IR SOLN
Status: DC | PRN
  Administered 2021-08-18: 1000 mL

## 2021-08-18 MED ORDER — ACETAMINOPHEN 325 MG PO TABS: 650.0000 mg | ORAL_TABLET | ORAL | Status: DC | PRN

## 2021-08-18 MED ORDER — DEXAMETHASONE SODIUM PHOSPHATE 10 MG/ML IJ SOLN
INTRAMUSCULAR | Status: AC
  Filled 2021-08-18: qty 1

## 2021-08-18 SURGICAL SUPPLY — 62 items
APPLICATOR COTTON TIP 6 STRL (MISCELLANEOUS) ×2 IMPLANT
APPLICATOR COTTON TIP 6IN STRL (MISCELLANEOUS) ×3
BAG COUNTER SPONGE SURGICOUNT (BAG) IMPLANT
CATH FOLEY 2WAY SLVR 18FR 30CC (CATHETERS) ×3 IMPLANT
CATH ROBINSON RED A/P 16FR (CATHETERS) ×3 IMPLANT
CATH ROBINSON RED A/P 8FR (CATHETERS) ×3 IMPLANT
CATH TIEMANN FOLEY 18FR 5CC (CATHETERS) ×3 IMPLANT
CHLORAPREP W/TINT 26 (MISCELLANEOUS) ×3 IMPLANT
CLIP LIGATING HEM O LOK PURPLE (MISCELLANEOUS) ×6 IMPLANT
COVER SURGICAL LIGHT HANDLE (MISCELLANEOUS) ×3 IMPLANT
COVER TIP SHEARS 8 DVNC (MISCELLANEOUS) ×2 IMPLANT
COVER TIP SHEARS 8MM DA VINCI (MISCELLANEOUS) ×1
CUTTER ECHEON FLEX ENDO 45 340 (ENDOMECHANICALS) ×3 IMPLANT
DECANTER SPIKE VIAL GLASS SM (MISCELLANEOUS) ×3 IMPLANT
DERMABOND ADVANCED (GAUZE/BANDAGES/DRESSINGS) ×1
DERMABOND ADVANCED .7 DNX12 (GAUZE/BANDAGES/DRESSINGS) ×2 IMPLANT
DRAIN CHANNEL RND F F (WOUND CARE) IMPLANT
DRAPE ARM DVNC X/XI (DISPOSABLE) ×8 IMPLANT
DRAPE COLUMN DVNC XI (DISPOSABLE) ×2 IMPLANT
DRAPE DA VINCI XI ARM (DISPOSABLE) ×4
DRAPE DA VINCI XI COLUMN (DISPOSABLE) ×1
DRAPE SURG IRRIG POUCH 19X23 (DRAPES) ×3 IMPLANT
DRSG TEGADERM 4X4.75 (GAUZE/BANDAGES/DRESSINGS) ×3 IMPLANT
ELECT PENCIL ROCKER SW 15FT (MISCELLANEOUS) ×3 IMPLANT
ELECT REM PT RETURN 15FT ADLT (MISCELLANEOUS) ×3 IMPLANT
EVACUATOR SILICONE 100CC (DRAIN) ×3 IMPLANT
GAUZE 4X4 16PLY ~~LOC~~+RFID DBL (SPONGE) ×3 IMPLANT
GLOVE SURG ENC MOIS LTX SZ6.5 (GLOVE) ×3 IMPLANT
GLOVE SURG ENC TEXT LTX SZ7.5 (GLOVE) ×6 IMPLANT
GOWN STRL REUS W/TWL LRG LVL3 (GOWN DISPOSABLE) ×9 IMPLANT
HOLDER FOLEY CATH W/STRAP (MISCELLANEOUS) ×3 IMPLANT
IRRIG SUCT STRYKERFLOW 2 WTIP (MISCELLANEOUS) ×3
IRRIGATION SUCT STRKRFLW 2 WTP (MISCELLANEOUS) ×2 IMPLANT
IV LACTATED RINGERS 1000ML (IV SOLUTION) ×3 IMPLANT
KIT TURNOVER KIT A (KITS) ×3 IMPLANT
NDL SAFETY ECLIPSE 18X1.5 (NEEDLE) ×2 IMPLANT
NEEDLE HYPO 18GX1.5 SHARP (NEEDLE) ×1
PACK ROBOT UROLOGY CUSTOM (CUSTOM PROCEDURE TRAY) ×3 IMPLANT
PENCIL SMOKE EVACUATOR (MISCELLANEOUS) IMPLANT
SCISSORS LAP 5X45 EPIX DISP (ENDOMECHANICALS) ×3 IMPLANT
SEAL CANN UNIV 5-8 DVNC XI (MISCELLANEOUS) ×8 IMPLANT
SEAL XI 5MM-8MM UNIVERSAL (MISCELLANEOUS) ×4
SET TUBE SMOKE EVAC HIGH FLOW (TUBING) ×3 IMPLANT
SOLUTION ELECTROLUBE (MISCELLANEOUS) ×3 IMPLANT
STAPLE RELOAD 45 GRN (STAPLE) ×2 IMPLANT
STAPLE RELOAD 45MM GREEN (STAPLE) ×1
SUT ETHILON 3 0 PS 1 (SUTURE) ×3 IMPLANT
SUT MNCRL 3 0 RB1 (SUTURE) ×2 IMPLANT
SUT MNCRL 3 0 VIOLET RB1 (SUTURE) ×2 IMPLANT
SUT MNCRL AB 4-0 PS2 18 (SUTURE) ×6 IMPLANT
SUT MONOCRYL 3 0 RB1 (SUTURE) ×2
SUT VIC AB 0 CT1 27 (SUTURE) ×1
SUT VIC AB 0 CT1 27XBRD ANTBC (SUTURE) ×2 IMPLANT
SUT VIC AB 0 UR5 27 (SUTURE) ×3 IMPLANT
SUT VIC AB 2-0 SH 27 (SUTURE) ×1
SUT VIC AB 2-0 SH 27X BRD (SUTURE) ×2 IMPLANT
SUT VIC AB 3-0 SH 27 (SUTURE) ×1
SUT VIC AB 3-0 SH 27XBRD (SUTURE) ×2 IMPLANT
SUT VICRYL 0 UR6 27IN ABS (SUTURE) ×6 IMPLANT
SYR 27GX1/2 1ML LL SAFETY (SYRINGE) ×3 IMPLANT
TOWEL OR NON WOVEN STRL DISP B (DISPOSABLE) ×3 IMPLANT
TROCAR XCEL NON-BLD 5MMX100MML (ENDOMECHANICALS) IMPLANT

## 2021-08-18 NOTE — Anesthesia Procedure Notes (Signed)
Procedure Name: Intubation Date/Time: 08/18/2021 2:51 PM Performed by: Lissa Morales, CRNA Pre-anesthesia Checklist: Patient identified, Emergency Drugs available, Suction available and Patient being monitored Patient Re-evaluated:Patient Re-evaluated prior to induction Oxygen Delivery Method: Circle system utilized Preoxygenation: Pre-oxygenation with 100% oxygen Induction Type: IV induction Ventilation: Mask ventilation without difficulty Laryngoscope Size: Mac and 4 Grade View: Grade II Tube type: Oral Tube size: 8.0 mm Number of attempts: 1 Airway Equipment and Method: Stylet and Oral airway Placement Confirmation: ETT inserted through vocal cords under direct vision, positive ETCO2 and breath sounds checked- equal and bilateral Secured at: 24 cm Tube secured with: Tape Dental Injury: Teeth and Oropharynx as per pre-operative assessment

## 2021-08-18 NOTE — Transfer of Care (Signed)
Immediate Anesthesia Transfer of Care Note  Patient: Vincent Herrera  Procedure(s) Performed: XI ROBOTIC ASSISTED LAPAROSCOPIC RADICAL PROSTATECTOMY LEVEL 2 LYMPHADENECTOMY/ PELVIC (Bilateral)  Patient Location: PACU  Anesthesia Type:General  Level of Consciousness: awake, alert , oriented and patient cooperative  Airway & Oxygen Therapy: Patient Spontanous Breathing and Patient connected to face mask oxygen  Post-op Assessment: Report given to RN, Post -op Vital signs reviewed and stable and Patient moving all extremities X 4  Post vital signs: stable  Last Vitals:  Vitals Value Taken Time  BP 174/84 08/18/21 1738  Temp    Pulse 71 08/18/21 1744  Resp 16 08/18/21 1744  SpO2 96 % 08/18/21 1744  Vitals shown include unvalidated device data.  Last Pain:  Vitals:   08/18/21 0927  TempSrc: Oral         Complications: No notable events documented.

## 2021-08-18 NOTE — Discharge Instructions (Signed)

## 2021-08-18 NOTE — Op Note (Signed)
Preoperative diagnosis: Clinically localized adenocarcinoma of the prostate (clinical stage T3a N0 M0)  Postoperative diagnosis: Clinically localized adenocarcinoma of the prostate (clinical stage T3a N0 M0)  Procedure:  Robotic assisted laparoscopic radical prostatectomy (non nerve sparing) Bilateral robotic assisted laparoscopic pelvic lymphadenectomy  Surgeon: Pryor Curia. M.D.  Assistant(s): Debbrah Alar, PA-C  Anesthesia: General  Complications: None  EBL: 100 mL  IVF:  2000 mL crystalloid  Specimens: Prostate and seminal vesicles Right pelvic lymph nodes Left pelvic lymph nodes  Disposition of specimens: Pathology  Drains: 20 Fr coude catheter # 19 Blake pelvic drain  Indication: Vincent Herrera is a 65 y.o. year old patient with locally advanced prostate cancer.  After a thorough review of the management options for treatment of prostate cancer, he elected to proceed with surgical therapy and the above procedure(s).  We have discussed the potential benefits and risks of the procedure, side effects of the proposed treatment, the likelihood of the patient achieving the goals of the procedure, and any potential problems that might occur during the procedure or recuperation. Informed consent has been obtained.  Description of procedure:  The patient was taken to the operating room and a general anesthetic was administered. He was given preoperative antibiotics, placed in the dorsal lithotomy position, and prepped and draped in the usual sterile fashion. Next a preoperative timeout was performed. A urethral catheter was placed into the bladder and a site was selected near the umbilicus for placement of the camera port. This was placed using a standard open Hassan technique which allowed entry into the peritoneal cavity under direct vision and without difficulty. A 12 mm port was placed and a pneumoperitoneum established. The camera was then used to inspect the abdomen and  there was no evidence of any intra-abdominal injuries or other abnormalities. The remaining abdominal ports were then placed. 8 mm robotic ports were placed in the right lower quadrant, left lower quadrant, and far left lateral abdominal wall. A 5 mm port was placed in the right upper quadrant and a 12 mm port was placed in the right lateral abdominal wall for laparoscopic assistance. All ports were placed under direct vision without difficulty. The surgical cart was then docked.   Utilizing the cautery scissors, the bladderc was reflected posteriorly allowing entry into the space of Retzius and identification of the endopelvic fascia and prostate. The periprostatic fat was then removed from the prostate allowing full exposure of the endopelvic fascia. The endopelvic fascia was then incised from the apex back to the base of the prostate bilaterally and the underlying levator muscle fibers were swept laterally off the prostate thereby isolating the dorsal venous complex. The dorsal vein was then stapled and divided with a 45 mm Flex Echelon stapler. Attention then turned to the bladder neck which was divided anteriorly thereby allowing entry into the bladder and exposure of the urethral catheter. The catheter balloon was deflated and the catheter was brought into the operative field and used to retract the prostate anteriorly. The posterior bladder neck was then examined and was divided allowing further dissection between the bladder and prostate posteriorly until the vasa deferentia and seminal vessels were identified. The vasa deferentia were isolated, divided, and lifted anteriorly. The seminal vesicles were dissected down to their tips with care to control the seminal vascular arterial blood supply. These structures were then lifted anteriorly and the space between Denonvillier's fascia and the anterior rectum was developed with a combination of sharp and blunt dissection. This isolated the  vascular pedicles of  the prostate.  A wide non nerve sparing dissection was performed with Weck clips used to ligate the vascular pedicles of the prostate bilaterally. The vascular pedicles of the prostate were then divided.  The urethra was then sharply transected allowing the prostate specimen to be disarticulated. The pelvis was copiously irrigated and hemostasis was ensured. There was no evidence for rectal injury.  Attention then turned to the right pelvic sidewall. The fibrofatty tissue between the external iliac vein, confluence of the iliac vessels, hypogastric artery, and Cooper's ligament was dissected free from the pelvic sidewall with care to preserve the obturator nerve. Weck clips were used for lymphostasis and hemostasis. An identical procedure was performed on the contralateral side and the lymphatic packets were removed for permanent pathologic analysis.  Attention then turned to the urethral anastomosis. A 2-0 Vicryl slip knot was placed between Denonvillier's fascia, the posterior bladder neck, and the posterior urethra to reapproximate these structures. A double-armed 3-0 Monocryl suture was then used to perform a 360 running tension-free anastomosis between the bladder neck and urethra. A new urethral catheter was then placed into the bladder and irrigated. There were no blood clots within the bladder and the anastomosis appeared to be watertight. A #19 Blake drain was then brought through the left lateral 8 mm port site and positioned appropriately within the pelvis. It was secured to the skin with a nylon suture. The surgical cart was then undocked. The right lateral 12 mm port site was closed at the fascial level with a 0 Vicryl suture placed laparoscopically. All remaining ports were then removed under direct vision. The prostate specimen was removed intact within the Endopouch retrieval bag via the periumbilical camera port site. This fascial opening was closed with two running 0 Vicryl sutures. 0.25%  Marcaine was then injected into all port sites and all incisions were reapproximated at the skin level with 3-0 Monocryl subcuticular sutures. Dermabond was applied to the skin. The patient appeared to tolerate the procedure well and without complications. The patient was able to be extubated and transferred to the recovery unit in satisfactory condition.  Pryor Curia MD

## 2021-08-18 NOTE — Anesthesia Preprocedure Evaluation (Signed)
Anesthesia Evaluation  Patient identified by MRN, date of birth, ID band Patient awake    Reviewed: Allergy & Precautions, NPO status , Patient's Chart, lab work & pertinent test results  Airway Mallampati: II  TM Distance: >3 FB Neck ROM: Full    Dental  (+) Upper Dentures, Lower Dentures   Pulmonary COPD, former smoker,    Pulmonary exam normal breath sounds clear to auscultation       Cardiovascular hypertension, Normal cardiovascular exam Rhythm:Regular Rate:Normal     Neuro/Psych negative neurological ROS  negative psych ROS   GI/Hepatic Neg liver ROS, GERD  ,  Endo/Other  negative endocrine ROS  Renal/GU negative Renal ROS  negative genitourinary   Musculoskeletal negative musculoskeletal ROS (+)   Abdominal   Peds negative pediatric ROS (+)  Hematology negative hematology ROS (+)   Anesthesia Other Findings   Reproductive/Obstetrics negative OB ROS                             Anesthesia Physical Anesthesia Plan  ASA: 2  Anesthesia Plan: General   Post-op Pain Management:    Induction: Intravenous  PONV Risk Score and Plan: 2 and Ondansetron, Dexamethasone and Treatment may vary due to age or medical condition  Airway Management Planned: Oral ETT  Additional Equipment:   Intra-op Plan:   Post-operative Plan: Extubation in OR  Informed Consent: I have reviewed the patients History and Physical, chart, labs and discussed the procedure including the risks, benefits and alternatives for the proposed anesthesia with the patient or authorized representative who has indicated his/her understanding and acceptance.     Dental advisory given  Plan Discussed with: CRNA and Surgeon  Anesthesia Plan Comments:         Anesthesia Quick Evaluation

## 2021-08-18 NOTE — Interval H&P Note (Signed)
History and Physical Interval Note:  08/18/2021 10:05 AM  Vincent Herrera  has presented today for surgery, with the diagnosis of PROSTATE CANCER.  The various methods of treatment have been discussed with the patient and family. After consideration of risks, benefits and other options for treatment, the patient has consented to  Procedure(s): XI ROBOTIC ASSISTED LAPAROSCOPIC RADICAL PROSTATECTOMY LEVEL 2 (N/A) LYMPHADENECTOMY/ PELVIC (Bilateral) as a surgical intervention.  The patient's history has been reviewed, patient examined, no change in status, stable for surgery.  I have reviewed the patient's chart and labs.  Questions were answered to the patient's satisfaction.     [employer]

## 2021-08-19 ENCOUNTER — Other Ambulatory Visit: Payer: Self-pay

## 2021-08-19 ENCOUNTER — Encounter (HOSPITAL_COMMUNITY): Payer: Self-pay | Admitting: Urology

## 2021-08-19 DIAGNOSIS — C61 Malignant neoplasm of prostate: Secondary | ICD-10-CM | POA: Diagnosis not present

## 2021-08-19 LAB — HEMOGLOBIN AND HEMATOCRIT, BLOOD
HCT: 35.1 % — ABNORMAL LOW (ref 39.0–52.0)
Hemoglobin: 11.7 g/dL — ABNORMAL LOW (ref 13.0–17.0)

## 2021-08-19 MED ORDER — SODIUM CHLORIDE 0.9 % IV BOLUS
500.0000 mL | Freq: Once | INTRAVENOUS | Status: AC
  Administered 2021-08-19: 500 mL via INTRAVENOUS

## 2021-08-19 MED ORDER — BISACODYL 10 MG RE SUPP
10.0000 mg | Freq: Once | RECTAL | Status: DC
  Filled 2021-08-19: qty 1

## 2021-08-19 MED ORDER — OXYCODONE-ACETAMINOPHEN 5-325 MG PO TABS: 1.0000 | ORAL_TABLET | Freq: Four times a day (QID) | ORAL | Status: DC | PRN

## 2021-08-19 MED ORDER — CHLORHEXIDINE GLUCONATE CLOTH 2 % EX PADS
6.0000 | MEDICATED_PAD | Freq: Every day | CUTANEOUS | Status: DC
  Administered 2021-08-19: 6 via TOPICAL

## 2021-08-19 NOTE — Progress Notes (Addendum)
Patient ID: Vincent Herrera, male   DOB: 01-09-1956, 65 y.o.   MRN: [id number]  1 Day Post-Op Subjective: The patient is doing well.  No nausea or vomiting. Pain is adequately controlled.  Objective: Vital signs in last 24 hours: Temp:  [97.8 F (36.6 C)-98.5 F (36.9 C)] 97.9 F (36.6 C) (10/04 0610) Pulse Rate:  [54-80] 54 (10/04 0610) Resp:  [7-24] 18 (10/04 0610) BP: (131-174)/(65-103) 131/71 (10/04 0610) SpO2:  [92 %-100 %] 100 % (10/04 0610) Weight:  [100 kg] 100 kg (10/03 2202)  Intake/Output from previous day: 10/03 0701 - 10/04 0700 In: 3638.6 [P.O.:240; I.V.:3298.6; IV Piggyback:100] Out: 580 [Urine:350; Drains:130; Blood:100] Intake/Output this shift: No intake/output data recorded.  Physical Exam:  General: Alert and oriented. CV: RRR Lungs: Clear bilaterally. GI: Soft, Nondistended. Incisions: Clean, dry, and intact Urine: Clear Extremities: Nontender, no erythema, no edema.  Lab Results: Recent Labs    08/18/21 2149 08/19/21 0443  HGB 13.2 11.7*  HCT 40.1 35.1*      Assessment/Plan: POD# 1 s/p robotic prostatectomy.  1) SL IVF 2) Ambulate, Incentive spirometry 3) Transition to oral pain medication 4) Dulcolax suppository 5) D/C pelvic drain 6) UOP a little on low side but now producing a large amount this morning and no indication for hemodynamic instability. Will give NS fluid bolus and monitor UOP through the morning as a precaution. 7) Plan for likely discharge later today   Pryor Curia. MD   LOS: 0 days   Dutch Gray 08/19/2021, 7:59 AM

## 2021-08-19 NOTE — Plan of Care (Signed)

## 2021-08-19 NOTE — Progress Notes (Signed)
AVS and discharge instructions reviewed w/ patient and son at bedside. Foley care teaching hand out and extra supplies given to patient. Foley care taught to patient and son, both verbalized understanding and had no further questions.

## 2021-08-19 NOTE — Discharge Summary (Signed)
  Date of admission: 08/18/2021  Date of discharge: 08/19/2021  Admission diagnosis: Prostate Cancer  Discharge diagnosis: Prostate Cancer  History and Physical: For full details, please see admission history and physical. Briefly, Vincent Herrera is a 65 y.o. gentleman with localized prostate cancer.  After discussing management/treatment options, he elected to proceed with surgical treatment.  Hospital Course: Vincent Herrera was taken to the operating room on 08/18/2021 and underwent a robotic assisted laparoscopic radical prostatectomy. He tolerated this procedure well and without complications. Postoperatively, he was able to be transferred to a regular hospital room following recovery from anesthesia.  He was able to begin ambulating the night of surgery. He remained hemodynamically stable overnight.  He had excellent urine output with appropriately minimal output from his pelvic drain and his pelvic drain was removed on POD #1.  He was transitioned to oral pain medication, tolerated a clear liquid diet, and had met all discharge criteria and was able to be discharged home later on POD#1.  Laboratory values:  Recent Labs    08/18/21 2149 08/19/21 0443  HGB 13.2 11.7*  HCT 40.1 35.1*    Disposition: Home  Discharge instruction: He was instructed to be ambulatory but to refrain from heavy lifting, strenuous activity, or driving. He was instructed on urethral catheter care.  Discharge medications:   Allergies as of 08/19/2021       Reactions   Other    NO BLOOD OR BLOOD PRODUCTS-PT IS [facility]IT[facility]        Medication List     STOP taking these medications    ibuprofen 200 MG tablet Commonly known as: ADVIL       TAKE these medications    ALPRAZolam 1 MG tablet Commonly known as: XANAX Take 1 mg by mouth 3 (three) times daily as needed for anxiety.   docusate sodium 100 MG capsule Commonly known as: COLACE Take 1 capsule (100 mg total) by mouth 2 (two) times  daily.   methocarbamol 750 MG tablet Commonly known as: ROBAXIN Take 750 mg by mouth in the morning and at bedtime.   olmesartan-hydrochlorothiazide 20-12.5 MG tablet Commonly known as: BENICAR HCT Take 1 tablet by mouth daily.   oxyCODONE-acetaminophen 10-325 MG tablet Commonly known as: PERCOCET Take 1 tablet by mouth every 6 (six) hours as needed for pain.   sildenafil 100 MG tablet Commonly known as: VIAGRA Take 100 mg by mouth daily as needed for erectile dysfunction.   sulfamethoxazole-trimethoprim 800-160 MG tablet Commonly known as: BACTRIM DS Take 1 tablet by mouth 2 (two) times daily. Start the day prior to foley removal appointment        Followup: He will followup in 1 week for catheter removal and to discuss his surgical pathology results.

## 2021-08-20 NOTE — Anesthesia Postprocedure Evaluation (Signed)
Anesthesia Post Note  Patient: Vincent Herrera  Procedure(s) Performed: XI ROBOTIC ASSISTED LAPAROSCOPIC RADICAL PROSTATECTOMY LEVEL 2 LYMPHADENECTOMY/ PELVIC (Bilateral)     Patient location during evaluation: PACU Anesthesia Type: General Level of consciousness: awake and alert Pain management: pain level controlled Vital Signs Assessment: post-procedure vital signs reviewed and stable Respiratory status: spontaneous breathing, nonlabored ventilation, respiratory function stable and patient connected to nasal cannula oxygen Cardiovascular status: blood pressure returned to baseline and stable Postop Assessment: no apparent nausea or vomiting Anesthetic complications: no   No notable events documented.  Last Vitals:  Vitals:   08/19/21 0610 08/19/21 1017  BP: 131/71 130/64  Pulse: (!) 54 (!) 58  Resp: 18 16  Temp: 36.6 C 36.9 C  SpO2: 100% 98%    Last Pain:  Vitals:   08/19/21 1017  TempSrc: Oral  PainSc:                  Albion Weatherholtz S

## 2021-08-23 LAB — SURGICAL PATHOLOGY

## 2024-11-16 DEATH — deceased
